# Patient Record
Sex: Female | Born: 1945 | Race: White | Hispanic: No | Marital: Married | Smoking: Never smoker
Health system: Southern US, Community
[De-identification: ages and names within clinical notes are randomized; demographics above are authoritative.]

## PROBLEM LIST (undated history)

## (undated) DIAGNOSIS — G8929 Other chronic pain: Secondary | ICD-10-CM

## (undated) DIAGNOSIS — N200 Calculus of kidney: Secondary | ICD-10-CM

## (undated) DIAGNOSIS — C4491 Basal cell carcinoma of skin, unspecified: Secondary | ICD-10-CM

## (undated) DIAGNOSIS — K219 Gastro-esophageal reflux disease without esophagitis: Secondary | ICD-10-CM

## (undated) DIAGNOSIS — C4490 Unspecified malignant neoplasm of skin, unspecified: Secondary | ICD-10-CM

## (undated) HISTORY — PX: VAGINAL HYSTERECTOMY: SHX2639

## (undated) HISTORY — PX: BACK SURGERY: SHX140

## (undated) HISTORY — DX: Calculus of kidney: N20.0

## (undated) HISTORY — PX: TUBAL LIGATION: SHX77

## (undated) HISTORY — PX: MOHS SURGERY: SUR867

## (undated) HISTORY — PX: TONSILLECTOMY: SUR1361

## (undated) HISTORY — DX: Gastro-esophageal reflux disease without esophagitis: K21.9

## (undated) HISTORY — DX: Other chronic pain: G89.29

## (undated) HISTORY — DX: Unspecified malignant neoplasm of skin, unspecified: C44.90

## (undated) HISTORY — DX: Basal cell carcinoma of skin, unspecified: C44.91

---

## 1998-12-21 ENCOUNTER — Other Ambulatory Visit: Admission: RE | Admit: 1998-12-21 | Discharge: 1998-12-21 | Payer: Self-pay | Admitting: Obstetrics and Gynecology

## 2000-08-01 ENCOUNTER — Encounter: Payer: Self-pay | Admitting: Surgery

## 2000-08-01 ENCOUNTER — Encounter: Admission: RE | Admit: 2000-08-01 | Discharge: 2000-08-01 | Payer: Self-pay | Admitting: Surgery

## 2000-09-25 ENCOUNTER — Other Ambulatory Visit: Admission: RE | Admit: 2000-09-25 | Discharge: 2000-09-25 | Payer: Self-pay | Admitting: Obstetrics and Gynecology

## 2002-10-07 ENCOUNTER — Encounter: Payer: Self-pay | Admitting: Neurosurgery

## 2002-10-07 ENCOUNTER — Ambulatory Visit (HOSPITAL_COMMUNITY): Admission: RE | Admit: 2002-10-07 | Discharge: 2002-10-07 | Payer: Self-pay | Admitting: Neurosurgery

## 2002-10-25 ENCOUNTER — Encounter: Admission: RE | Admit: 2002-10-25 | Discharge: 2002-10-25 | Payer: Self-pay | Admitting: Obstetrics and Gynecology

## 2002-10-25 ENCOUNTER — Encounter: Payer: Self-pay | Admitting: Obstetrics and Gynecology

## 2006-02-06 ENCOUNTER — Encounter: Admission: RE | Admit: 2006-02-06 | Discharge: 2006-02-06 | Payer: Self-pay | Admitting: Plastic Surgery

## 2006-09-16 ENCOUNTER — Other Ambulatory Visit: Admission: RE | Admit: 2006-09-16 | Discharge: 2006-09-16 | Payer: Self-pay | Admitting: *Deleted

## 2007-12-08 ENCOUNTER — Encounter: Admission: RE | Admit: 2007-12-08 | Discharge: 2007-12-08 | Payer: Self-pay | Admitting: *Deleted

## 2008-07-25 ENCOUNTER — Emergency Department (HOSPITAL_COMMUNITY): Admission: EM | Admit: 2008-07-25 | Discharge: 2008-07-25 | Payer: Self-pay | Admitting: Family Medicine

## 2008-07-26 ENCOUNTER — Encounter (INDEPENDENT_AMBULATORY_CARE_PROVIDER_SITE_OTHER): Payer: Self-pay | Admitting: Emergency Medicine

## 2008-07-26 ENCOUNTER — Ambulatory Visit (HOSPITAL_COMMUNITY): Admission: RE | Admit: 2008-07-26 | Discharge: 2008-07-26 | Payer: Self-pay | Admitting: Emergency Medicine

## 2008-07-26 ENCOUNTER — Ambulatory Visit: Payer: Self-pay | Admitting: Vascular Surgery

## 2010-04-17 ENCOUNTER — Encounter: Admission: RE | Admit: 2010-04-17 | Discharge: 2010-04-17 | Payer: Self-pay | Admitting: Obstetrics and Gynecology

## 2011-07-04 ENCOUNTER — Other Ambulatory Visit: Payer: Self-pay | Admitting: Obstetrics and Gynecology

## 2011-07-04 DIAGNOSIS — Z1231 Encounter for screening mammogram for malignant neoplasm of breast: Secondary | ICD-10-CM

## 2011-08-08 ENCOUNTER — Ambulatory Visit: Payer: Self-pay

## 2013-03-30 ENCOUNTER — Ambulatory Visit
Admission: RE | Admit: 2013-03-30 | Discharge: 2013-03-30 | Disposition: A | Discharge status: Home / Self Care | Payer: Self-pay | Source: Ambulatory Visit

## 2013-03-30 ENCOUNTER — Other Ambulatory Visit: Payer: Self-pay

## 2013-03-30 DIAGNOSIS — Z1231 Encounter for screening mammogram for malignant neoplasm of breast: Secondary | ICD-10-CM

## 2014-07-06 ENCOUNTER — Other Ambulatory Visit: Payer: Self-pay

## 2014-07-06 DIAGNOSIS — Z1231 Encounter for screening mammogram for malignant neoplasm of breast: Secondary | ICD-10-CM

## 2014-07-25 ENCOUNTER — Ambulatory Visit
Admission: RE | Admit: 2014-07-25 | Discharge: 2014-07-25 | Disposition: A | Discharge status: Home / Self Care | Payer: Medicare Other | Source: Ambulatory Visit

## 2014-07-25 DIAGNOSIS — Z1231 Encounter for screening mammogram for malignant neoplasm of breast: Secondary | ICD-10-CM

## 2015-10-13 DIAGNOSIS — Z01419 Encounter for gynecological examination (general) (routine) without abnormal findings: Secondary | ICD-10-CM | POA: Diagnosis not present

## 2015-10-13 DIAGNOSIS — Z1272 Encounter for screening for malignant neoplasm of vagina: Secondary | ICD-10-CM | POA: Diagnosis not present

## 2015-10-13 DIAGNOSIS — Z90710 Acquired absence of both cervix and uterus: Secondary | ICD-10-CM | POA: Diagnosis not present

## 2015-10-16 DIAGNOSIS — B029 Zoster without complications: Secondary | ICD-10-CM | POA: Diagnosis not present

## 2015-10-20 DIAGNOSIS — L853 Xerosis cutis: Secondary | ICD-10-CM | POA: Diagnosis not present

## 2015-10-20 DIAGNOSIS — Z85828 Personal history of other malignant neoplasm of skin: Secondary | ICD-10-CM | POA: Diagnosis not present

## 2015-11-03 DIAGNOSIS — Z0100 Encounter for examination of eyes and vision without abnormal findings: Secondary | ICD-10-CM | POA: Diagnosis not present

## 2015-12-13 DIAGNOSIS — R0789 Other chest pain: Secondary | ICD-10-CM | POA: Diagnosis not present

## 2016-05-24 DIAGNOSIS — Z0000 Encounter for general adult medical examination without abnormal findings: Secondary | ICD-10-CM | POA: Diagnosis not present

## 2016-07-04 DIAGNOSIS — E559 Vitamin D deficiency, unspecified: Secondary | ICD-10-CM | POA: Diagnosis not present

## 2016-07-04 DIAGNOSIS — D72819 Decreased white blood cell count, unspecified: Secondary | ICD-10-CM | POA: Diagnosis not present

## 2016-07-09 DIAGNOSIS — N200 Calculus of kidney: Secondary | ICD-10-CM | POA: Diagnosis not present

## 2016-07-09 DIAGNOSIS — G459 Transient cerebral ischemic attack, unspecified: Secondary | ICD-10-CM | POA: Diagnosis not present

## 2016-07-09 DIAGNOSIS — Z85828 Personal history of other malignant neoplasm of skin: Secondary | ICD-10-CM | POA: Diagnosis not present

## 2016-07-09 DIAGNOSIS — Z820 Family history of epilepsy and other diseases of the nervous system: Secondary | ICD-10-CM | POA: Diagnosis not present

## 2016-07-09 DIAGNOSIS — Z0001 Encounter for general adult medical examination with abnormal findings: Secondary | ICD-10-CM | POA: Diagnosis not present

## 2016-07-11 ENCOUNTER — Other Ambulatory Visit: Payer: Self-pay | Admitting: Internal Medicine

## 2016-07-11 DIAGNOSIS — G459 Transient cerebral ischemic attack, unspecified: Secondary | ICD-10-CM

## 2016-07-11 DIAGNOSIS — R202 Paresthesia of skin: Secondary | ICD-10-CM

## 2016-07-22 ENCOUNTER — Ambulatory Visit
Admission: RE | Admit: 2016-07-22 | Discharge: 2016-07-22 | Disposition: A | Discharge status: Home / Self Care | Payer: Medicare Other | Source: Ambulatory Visit | Attending: Internal Medicine | Admitting: Internal Medicine

## 2016-07-22 DIAGNOSIS — R930 Abnormal findings on diagnostic imaging of skull and head, not elsewhere classified: Secondary | ICD-10-CM | POA: Diagnosis not present

## 2016-07-22 DIAGNOSIS — G459 Transient cerebral ischemic attack, unspecified: Secondary | ICD-10-CM | POA: Diagnosis not present

## 2016-07-22 DIAGNOSIS — R202 Paresthesia of skin: Secondary | ICD-10-CM

## 2016-09-12 ENCOUNTER — Encounter: Payer: Self-pay | Admitting: Neurology

## 2016-09-12 ENCOUNTER — Ambulatory Visit (INDEPENDENT_AMBULATORY_CARE_PROVIDER_SITE_OTHER): Payer: Medicare Other | Admitting: Neurology

## 2016-09-12 ENCOUNTER — Telehealth: Payer: Self-pay

## 2016-09-12 VITALS — BP 124/70 | HR 78 | Resp 16 | Ht 65.0 in | Wt 153.0 lb

## 2016-09-12 DIAGNOSIS — I749 Embolism and thrombosis of unspecified artery: Secondary | ICD-10-CM | POA: Diagnosis not present

## 2016-09-12 DIAGNOSIS — G459 Transient cerebral ischemic attack, unspecified: Secondary | ICD-10-CM

## 2016-09-12 MED ORDER — ASPIRIN EC 81 MG PO TBEC: 81.0000 mg | DELAYED_RELEASE_TABLET | Freq: Every day | ORAL | 5 refills | Status: DC

## 2016-09-12 NOTE — Progress Notes (Signed)
Provider:  Larey Seat, M D  Referring Provider: Deland Pretty, MD Primary Care Physician:  Horatio Pel, MD  Chief Complaint  Patient presents with  . New Patient (Initial Visit)    Rm 11. Patient reports that she had a "quick" episode of nausea and R leg heaviness. This occured about 6-8 weeks ago.     HPI:  Sally Richardson is a 71 y.o. female seen here as a referral  from Dr. Shelia Media / Dr. 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 He was concerned that this could be a TIA. Her leg felt heavy what she was talking to her son and granddaughter and she tried lifting and moving it. She was able to do so but the right leg felt very different from her left. At the place was no possibility to sit down, so she drove herself home and took an 81 mg aspirin. 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 She was already referred for Carotid Doppler studies which returned normal and an MRI of the brain which also returned to normal limits.  The only question is if she should continue to take antiplatelet therapy and if there are any other risk factors that need to be evaluated such as possible paroxysmal atrial fibrillation, Echo and bubble study.  EKG was normal.   Past medical history is positive for  Frequent ocular migraines, aura, chronic lower back pain, vitamin D deficiency, paresthesias of the right foot, calcium kidney stones, basal cell carcinoma, possible TIA. Her right patella has not worked well ever since she had lower back surgery, numbness on the lateral right foot, L 5    Mother and brother had massive strokes, brother died at age 24.   Social history :  She drinks one or 2 coffees a day but no other caffeinated sodas or ice tea,  she drinks mostly water. She is not a nicotine user  and socially drinks alcohol seldomly. 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     Review of Systems: Out of a complete 14 system review, the patient complains of only the following symptoms, and all other reviewed systems are negative.   Social History   Social History  . Marital status: Married    Spouse name: N/A  . Number of children: 1  . Years of education: RN   Occupational History  . [employer]    Social History Main Topics  . Smoking status: Never Smoker  . Smokeless tobacco: Never Used  . Alcohol use Yes     Comment: Occ  . Drug use: No  . Sexual activity: Not on file   Other Topics Concern  . Not on file   Social History Narrative   Drinks 1-2 cups of coffee a day     No family history on file.  Past Medical History:  Diagnosis Date  . Skin cancer     Past Surgical History:  Procedure Laterality Date  . [place], 2010  . MOHS SURGERY    . TONSILLECTOMY    . TUBAL LIGATION    . VAGINAL HYSTERECTOMY      Current Outpatient Prescriptions  Medication Sig Dispense Refill  . cholecalciferol (VITAMIN D) 1000 units tablet Take 1,000 Units by mouth daily.    Marland Kitchen estradiol (ESTRACE) 0.5 MG tablet   11  . ibuprofen (ADVIL,MOTRIN) 200 MG tablet Take 200 mg by mouth every 6 (six) hours as needed.    . Multiple Vitamins-Minerals (MULTIVITAMIN ADULT PO) Take by mouth.     No current facility-administered medications for this visit.     Allergies as of 09/12/2016  . (No Known Allergies)    Vitals: BP 124/70   Pulse 78   Resp 16   Ht 5\' 5"  (1.651 m)   Wt 153 lb (69.4 kg)   BMI 25.46 kg/m  Last Weight:  Wt Readings from Last 1 Encounters:  09/12/16 153 lb (69.4 kg)   Last Height:   Ht Readings from Last 1 Encounters:  09/12/16 5\' 5"  (1.651 m)    Physical exam:  General: The patient is awake, alert and appears  not in acute distress. The patient is well groomed. Head: Normocephalic, atraumatic. Neck is supple. Mallampati 2, neck circumference:14.25  Cardiovascular:  Regular rate and rhythm, without  murmurs or carotid bruit, and without distended neck veins. Respiratory: Lungs are clear to auscultation. Skin:  Without evidence of edema, or rash Trunk: BMI is elevated and patient  has normal posture.  Neurologic exam :The patient is awake and alert, oriented to place and time.  Memory subjective described as intact. There is a normal attention span & concentration ability. Speech is fluent without  dysarthria, dysphonia or aphasia. Mood and affect are appropriate. Cranial nerves:Pupils are equal and briskly reactive to light. Funduscopic exam without evidence of pallor or edema. Extraocular movements  in vertical and horizontal planes intact and without nystagmus.  Visual fields by finger perimetry are intact. Hearing to finger rub intact.  Facial sensation intact to fine touch. Facial motor strength is symmetric and tongue and uvula move midline. Tongue protrusion into either cheek is normal. Shoulder shrug is normal.  Motor exam:   Normal tone ,muscle bulk and symmetric  strength in all extremities. Sensory:  Numbness in L5 dermatome, right leg only.  Fine touch, pinprick and vibration were tested in all extremities. Proprioception was normal. Coordination: Rapid alternating movements in the fingers/hands were normal. Finger-to-nose maneuver  normal without evidence of ataxia, dysmetria or tremor. Gait and station: Patient walks without assistive device and is able unassisted to climb up to the exam table. Strength within normal limits. Stance is stable and normal. Tandem gait is unfragmented. Romberg testing is negative Deep tendon reflexes: in the  upper extremities are symmetric and intact. Patella attenuated on the right.  Babinski maneuver response is  downgoing.   Assessment:  After physical and  neurologic examination, review of laboratory studies, imaging, neurophysiology testing and pre-existing records, assessment is that of :   [name] described her right lower extremity heaviness spell as being distinctly different from any dysesthesia she may have encountered before related to radiculopathy and lower back degeneration.  I do share the feel that this could have been a TIA, and we would expect the MRI to be normal unless a stroke would have happened. To complete her workup I will order an echocardiogram, a bubble study, and a portable cardiac monitor. None of this will keep her out of the OR.  She needs to start taking a baby ASA 81 mg, if not tolerated daily, take every other day.   Plan:  Treatment plan and additional workup : see  above, RV in 4 weeks.       Asencion Partridge Krista Godsil MD 09/12/2016

## 2016-09-12 NOTE — Telephone Encounter (Signed)
Dr. Brett Fairy would like patient get Bubble study, echocardiogram, and cardiac event monitor completed at [facility]. Patient is in surgery 3 days a week, and Cardiology may be able to work with her schedule.

## 2016-09-16 NOTE — Telephone Encounter (Signed)
Patient is scheduled for her Echocardiogram Bubble and cardiac monitor 10/01/2016 arrive at 7:15 am and after echo she will pick up monitor. I have spoke to patient and she is aware of her appointment and apt scheduled for her day off.  Thanks Hinton Dyer

## 2016-09-30 ENCOUNTER — Other Ambulatory Visit: Payer: Self-pay | Admitting: Neurology

## 2016-09-30 DIAGNOSIS — I4891 Unspecified atrial fibrillation: Secondary | ICD-10-CM

## 2016-09-30 DIAGNOSIS — I749 Embolism and thrombosis of unspecified artery: Principal | ICD-10-CM

## 2016-09-30 DIAGNOSIS — G459 Transient cerebral ischemic attack, unspecified: Secondary | ICD-10-CM

## 2016-10-01 ENCOUNTER — Ambulatory Visit (INDEPENDENT_AMBULATORY_CARE_PROVIDER_SITE_OTHER): Payer: Medicare Other

## 2016-10-01 ENCOUNTER — Ambulatory Visit (HOSPITAL_COMMUNITY): Payer: Medicare Other | Attending: Cardiovascular Disease

## 2016-10-01 ENCOUNTER — Other Ambulatory Visit: Payer: Self-pay

## 2016-10-01 DIAGNOSIS — I4891 Unspecified atrial fibrillation: Secondary | ICD-10-CM

## 2016-10-01 DIAGNOSIS — I340 Nonrheumatic mitral (valve) insufficiency: Secondary | ICD-10-CM | POA: Insufficient documentation

## 2016-10-01 DIAGNOSIS — I749 Embolism and thrombosis of unspecified artery: Secondary | ICD-10-CM | POA: Diagnosis not present

## 2016-10-01 DIAGNOSIS — G459 Transient cerebral ischemic attack, unspecified: Secondary | ICD-10-CM

## 2016-10-02 ENCOUNTER — Telehealth: Payer: Self-pay

## 2016-10-02 NOTE — Telephone Encounter (Signed)
I spoke to patient and she is aware of results.  Patient states that no one has ever mentioned mitral valve prolapse, and asks if you can expand on this?  Also she has the heart monitor on now, asks how long you would like for her to wear it (hoping for as little time as possible)?   If you call her and she does not answer, ok to leave message and a time that she can call you back at.

## 2016-10-02 NOTE — Telephone Encounter (Signed)
-----   Message from Larey Seat, MD sent at 10/01/2016 10:09 AM EST ----- Echocardiogram in normal limits - possible mitral valve prolaps? No thrombos found. Good result CD

## 2016-10-02 NOTE — Telephone Encounter (Signed)
I explained to Mrs. caregiver that I suspect there may be a mitral valve prolapse but this is not a pathological finding, but rather a variation of the norm. She also received a cardiac monitor yesterday and is already tired of wearing it. I explained to her that it is meant to be worn for 30 days, but if at any time  atrial fibrillation is revealed, we can discontinue there. CD

## 2016-11-21 ENCOUNTER — Other Ambulatory Visit: Payer: Self-pay | Admitting: Obstetrics and Gynecology

## 2016-11-21 DIAGNOSIS — Z1231 Encounter for screening mammogram for malignant neoplasm of breast: Secondary | ICD-10-CM

## 2016-12-02 ENCOUNTER — Telehealth: Payer: Self-pay

## 2016-12-02 NOTE — Telephone Encounter (Signed)
-----   Message from Larey Seat, MD sent at 12/02/2016 10:04 AM EDT ----- All normal cardiac monitor results. CD

## 2016-12-02 NOTE — Telephone Encounter (Signed)
I called pt, left a detailed message on pt's home phone per DPR, advising pt that her cardiac monitor results were normal, and if she had any questions to call the office back.

## 2016-12-03 ENCOUNTER — Telehealth: Payer: Self-pay | Admitting: Neurology

## 2016-12-03 NOTE — Telephone Encounter (Signed)
Patient requests call back from nurse regarding if followup apt is necessary since she got the results of the tests. She insists her apt was on 5/3 not 5/7 and cannot come on 5/7. Best call back is [phone]

## 2016-12-04 NOTE — Telephone Encounter (Signed)
I spoke to Dr. Brett Fairy. Dr. Brett Fairy said that since the pt's testing was all normal, she does not need a follow up appt. If her symptoms worsen or fail to improve, pt should call our office.  I called pt to discuss. No answer, left a message asking her to call me back.

## 2016-12-04 NOTE — Telephone Encounter (Signed)
Patient called office and notified of Dr. Edwena Felty message patient voiced understanding.

## 2016-12-09 ENCOUNTER — Ambulatory Visit: Payer: Self-pay | Admitting: Neurology

## 2016-12-11 ENCOUNTER — Other Ambulatory Visit: Payer: Self-pay | Admitting: Obstetrics and Gynecology

## 2016-12-11 DIAGNOSIS — R5381 Other malaise: Secondary | ICD-10-CM

## 2016-12-13 ENCOUNTER — Other Ambulatory Visit: Payer: Self-pay | Admitting: Obstetrics and Gynecology

## 2016-12-13 ENCOUNTER — Other Ambulatory Visit: Payer: Self-pay | Admitting: Internal Medicine

## 2016-12-13 DIAGNOSIS — R5381 Other malaise: Secondary | ICD-10-CM

## 2016-12-13 DIAGNOSIS — E2839 Other primary ovarian failure: Secondary | ICD-10-CM

## 2017-01-02 ENCOUNTER — Ambulatory Visit
Admission: RE | Admit: 2017-01-02 | Discharge: 2017-01-02 | Disposition: A | Discharge status: Home / Self Care | Payer: Medicare Other | Source: Ambulatory Visit | Attending: Obstetrics and Gynecology | Admitting: Obstetrics and Gynecology

## 2017-01-02 DIAGNOSIS — Z1231 Encounter for screening mammogram for malignant neoplasm of breast: Secondary | ICD-10-CM

## 2017-01-02 DIAGNOSIS — Z13820 Encounter for screening for osteoporosis: Secondary | ICD-10-CM | POA: Diagnosis not present

## 2017-01-02 DIAGNOSIS — E2839 Other primary ovarian failure: Secondary | ICD-10-CM

## 2017-08-29 DIAGNOSIS — D72819 Decreased white blood cell count, unspecified: Secondary | ICD-10-CM | POA: Diagnosis not present

## 2017-09-10 DIAGNOSIS — Z0000 Encounter for general adult medical examination without abnormal findings: Secondary | ICD-10-CM | POA: Diagnosis not present

## 2018-05-28 IMAGING — US US CAROTID DUPLEX BILAT
1 series · 13 of 24 positions shown · non-contrast
Comparison: None.

CLINICAL DATA: TIA.  Right leg heaviness transiently 2 months ago.

EXAM:
BILATERAL CAROTID DUPLEX ULTRASOUND
TECHNIQUE: Gray scale imaging, color Doppler and duplex ultrasound was
performed of bilateral carotid and vertebral arteries in the neck.
TECHNIQUE: Quantification of carotid stenosis is based on velocity parameters
that correlate the residual internal carotid diameter with
NASCET-based stenosis levels, using the diameter of the distal
internal carotid lumen as the denominator for stenosis measurement.

[Series 1: us carotid duplex bilat · 0.06mm/px · 13 of 53 slices shown]
[im 1/53]
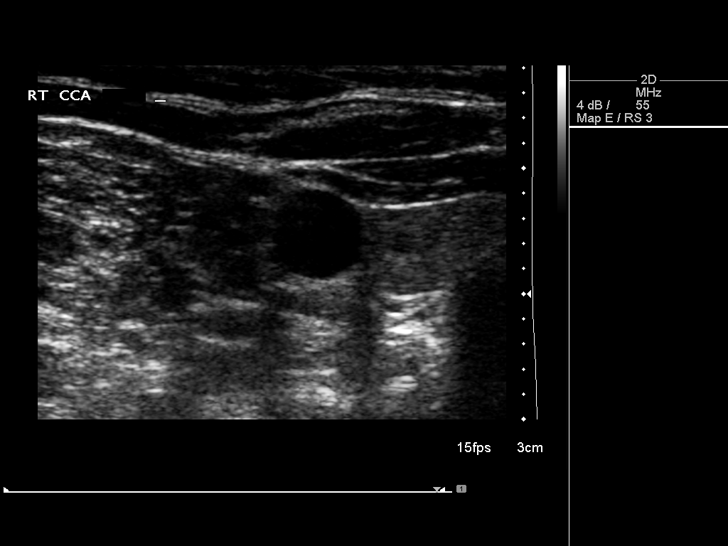
[im 5/53]
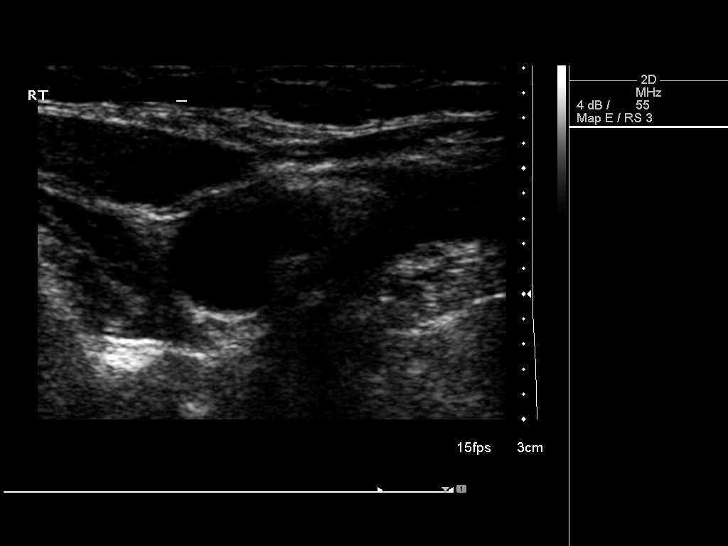
[im 10/53]
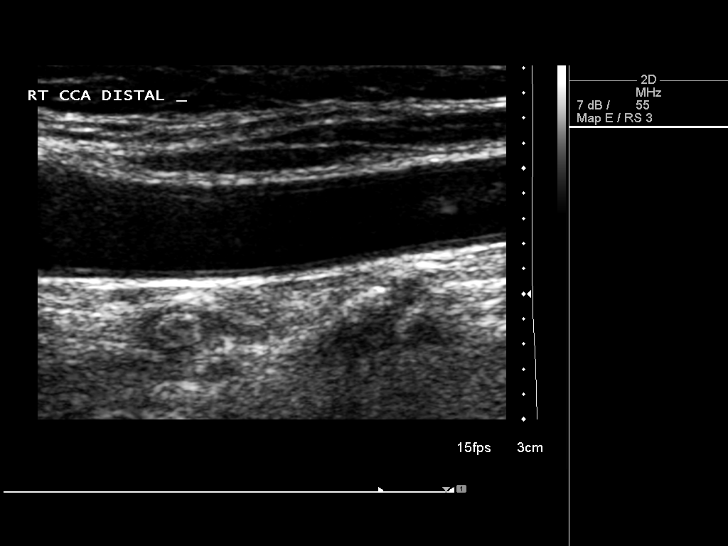
[im 14/53]
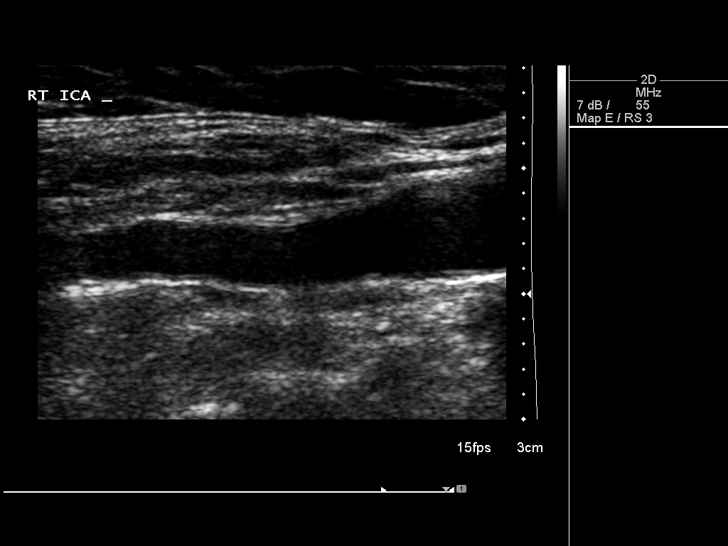
[im 19/53]
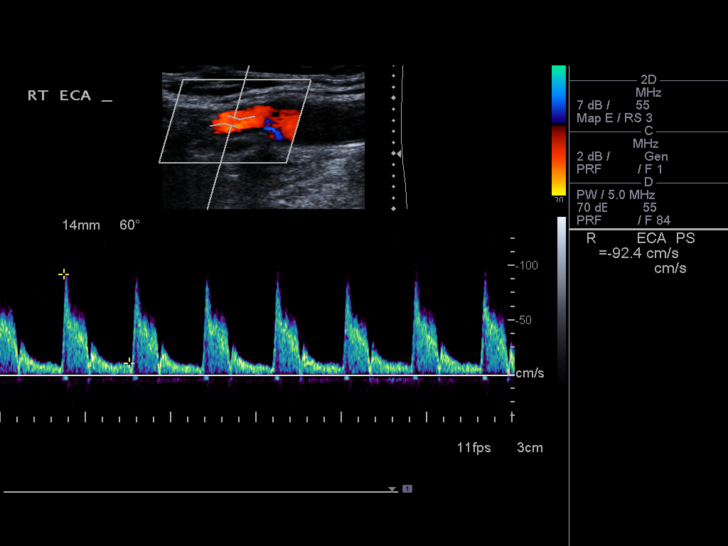
[im 23/53]
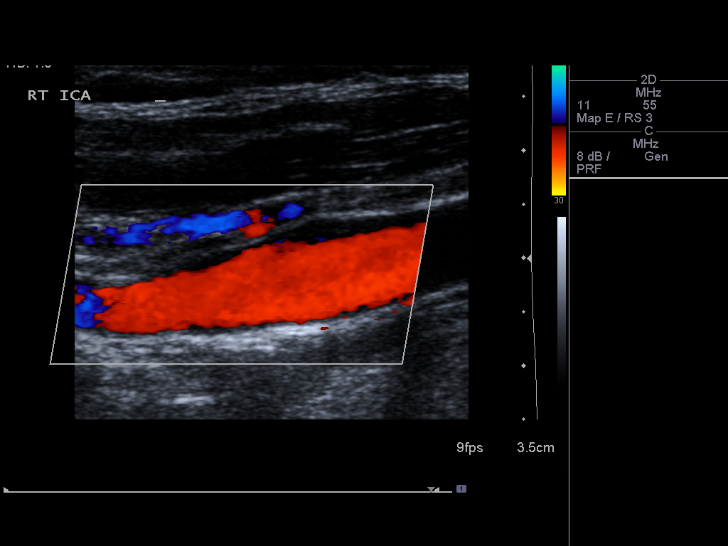
[im 28/53]
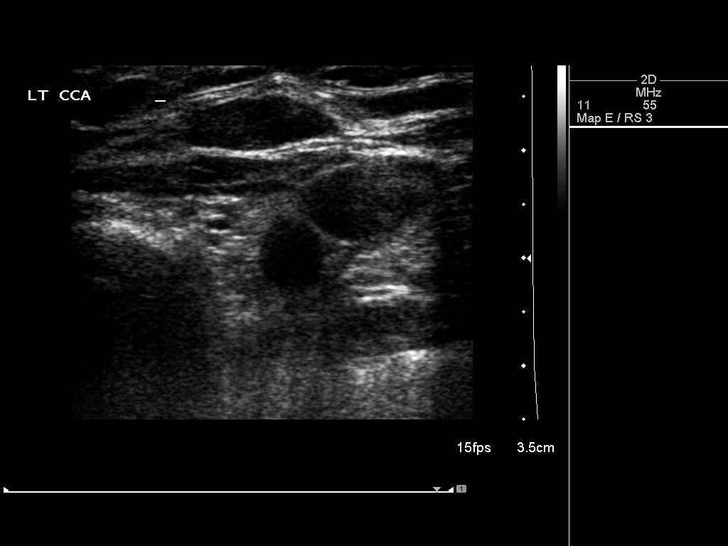
[im 30/53]
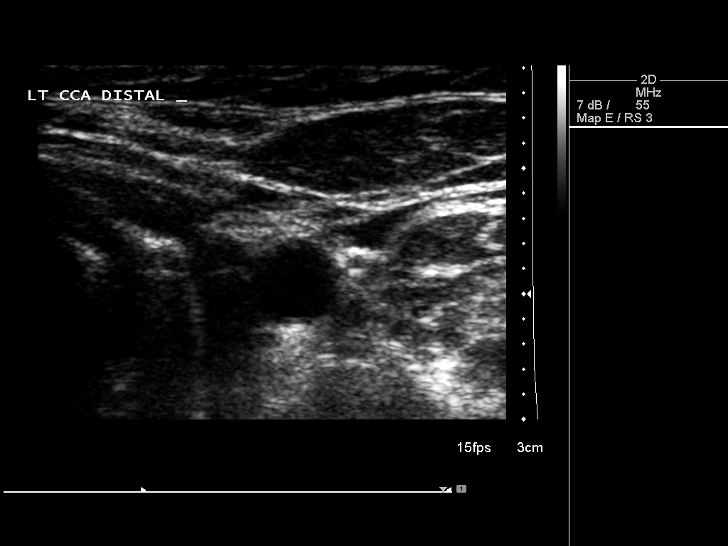
[im 34/53]
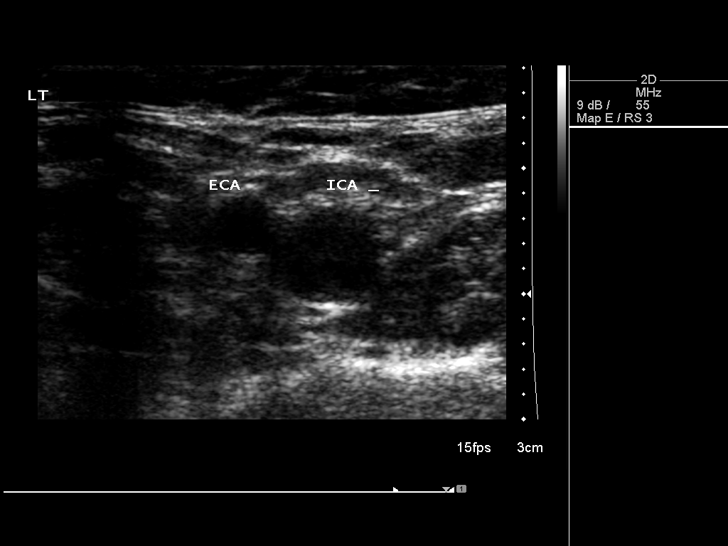
[im 39/53]
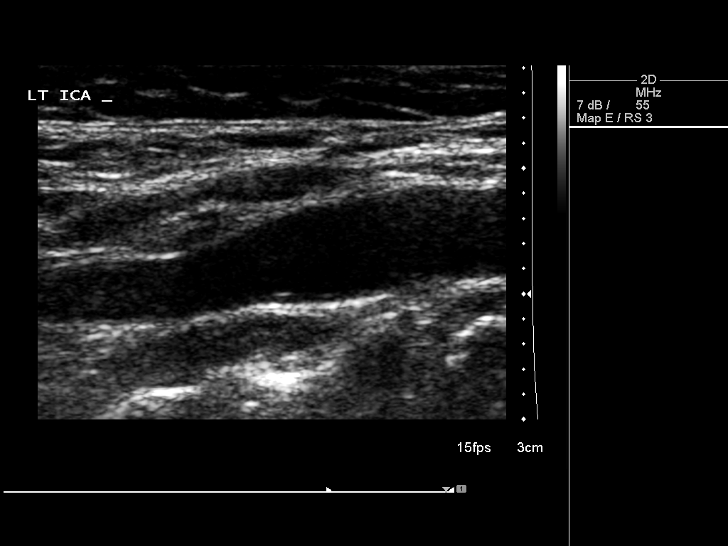
[im 43/53]
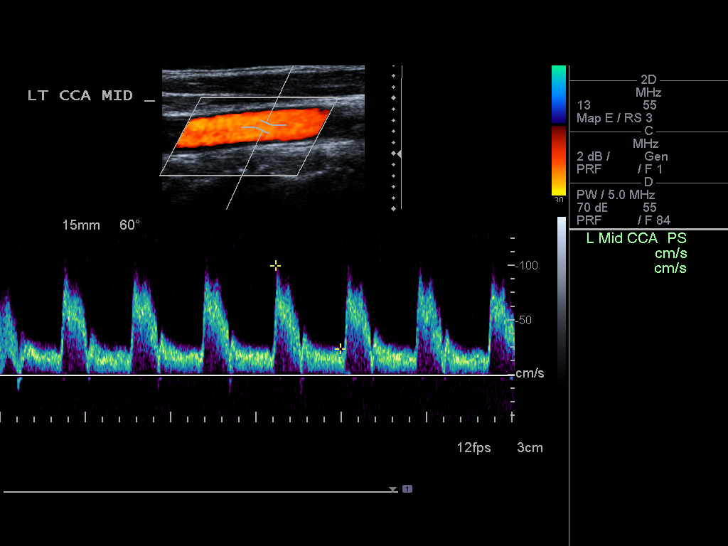
[im 48/53]
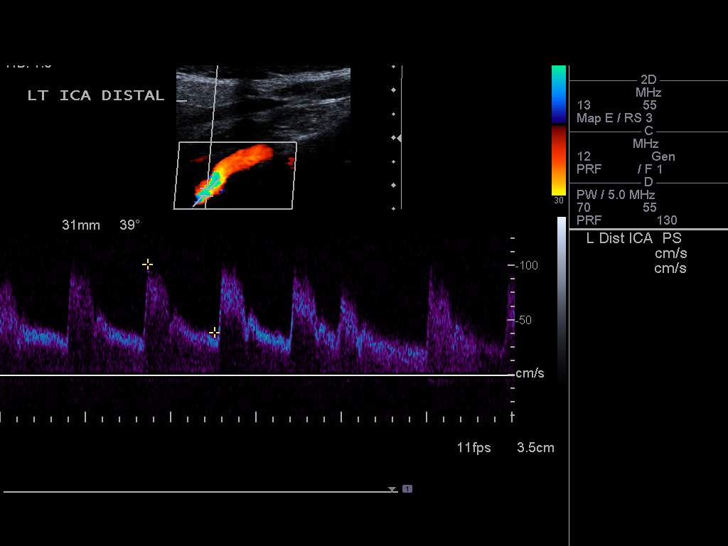
[im 53/53]
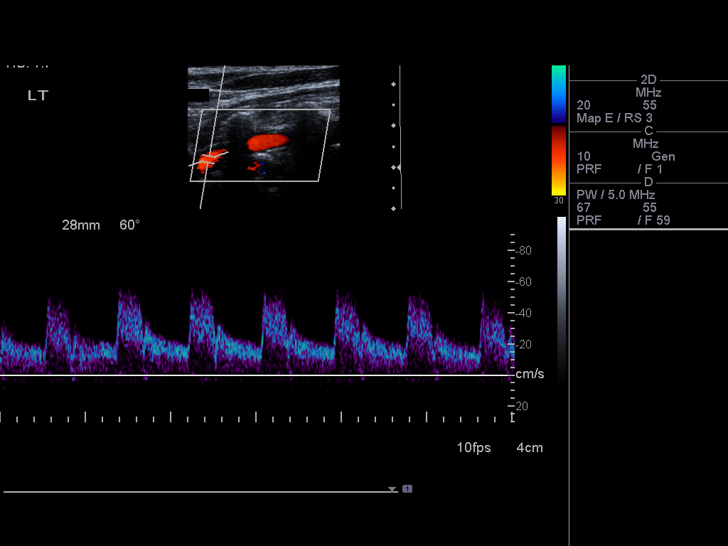

[13 of 24 positions shown; findings below may reference images not displayed]

The following velocity measurements were obtained:

PEAK SYSTOLIC/END DIASTOLIC

RIGHT

ICA:                     87/35cm/sec

CCA:                     99/19cm/sec

SYSTOLIC ICA/CCA RATIO:

DIASTOLIC ICA/CCA RATIO:

ECA:                     92cm/sec

LEFT

ICA:                     102/39cm/sec

CCA:                     107/23cm/sec

SYSTOLIC ICA/CCA RATIO:

DIASTOLIC ICA/CCA RATIO:

ECA:                     94cm/sec
FINDINGS: RIGHT CAROTID ARTERY: No significant plaque or stenosis. Normal
waveforms and color Doppler signal.

RIGHT VERTEBRAL ARTERY:  Normal flow direction and waveform.

LEFT CAROTID ARTERY: No plaque or stenosis. Normal waveforms and
color Doppler signal.

LEFT VERTEBRAL ARTERY: Normal flow direction and waveform.
IMPRESSION: Negative

## 2018-09-04 DIAGNOSIS — Z1272 Encounter for screening for malignant neoplasm of vagina: Secondary | ICD-10-CM | POA: Diagnosis not present

## 2018-09-04 DIAGNOSIS — Z6826 Body mass index (BMI) 26.0-26.9, adult: Secondary | ICD-10-CM | POA: Diagnosis not present

## 2018-09-04 DIAGNOSIS — Z01419 Encounter for gynecological examination (general) (routine) without abnormal findings: Secondary | ICD-10-CM | POA: Diagnosis not present

## 2018-09-10 DIAGNOSIS — Z7289 Other problems related to lifestyle: Secondary | ICD-10-CM | POA: Diagnosis not present

## 2018-09-10 DIAGNOSIS — Z1159 Encounter for screening for other viral diseases: Secondary | ICD-10-CM | POA: Diagnosis not present

## 2018-09-10 DIAGNOSIS — Z0000 Encounter for general adult medical examination without abnormal findings: Secondary | ICD-10-CM | POA: Diagnosis not present

## 2018-09-10 DIAGNOSIS — D72819 Decreased white blood cell count, unspecified: Secondary | ICD-10-CM | POA: Diagnosis not present

## 2018-09-15 DIAGNOSIS — M545 Low back pain: Secondary | ICD-10-CM | POA: Diagnosis not present

## 2018-09-15 DIAGNOSIS — Z820 Family history of epilepsy and other diseases of the nervous system: Secondary | ICD-10-CM | POA: Diagnosis not present

## 2018-09-15 DIAGNOSIS — Z85828 Personal history of other malignant neoplasm of skin: Secondary | ICD-10-CM | POA: Diagnosis not present

## 2018-09-15 DIAGNOSIS — Z0000 Encounter for general adult medical examination without abnormal findings: Secondary | ICD-10-CM | POA: Diagnosis not present

## 2018-10-02 ENCOUNTER — Other Ambulatory Visit: Payer: Self-pay | Admitting: Obstetrics and Gynecology

## 2018-10-02 DIAGNOSIS — Z1231 Encounter for screening mammogram for malignant neoplasm of breast: Secondary | ICD-10-CM

## 2018-10-16 ENCOUNTER — Other Ambulatory Visit: Payer: Self-pay

## 2018-10-16 ENCOUNTER — Ambulatory Visit
Admission: RE | Admit: 2018-10-16 | Discharge: 2018-10-16 | Disposition: A | Discharge status: Home / Self Care | Payer: Medicare Other | Source: Ambulatory Visit | Attending: Obstetrics and Gynecology | Admitting: Obstetrics and Gynecology

## 2018-10-16 DIAGNOSIS — Z1231 Encounter for screening mammogram for malignant neoplasm of breast: Secondary | ICD-10-CM

## 2019-07-23 DIAGNOSIS — D2272 Melanocytic nevi of left lower limb, including hip: Secondary | ICD-10-CM | POA: Diagnosis not present

## 2019-07-23 DIAGNOSIS — C44719 Basal cell carcinoma of skin of left lower limb, including hip: Secondary | ICD-10-CM | POA: Diagnosis not present

## 2019-07-23 DIAGNOSIS — C44712 Basal cell carcinoma of skin of right lower limb, including hip: Secondary | ICD-10-CM | POA: Diagnosis not present

## 2019-07-23 DIAGNOSIS — Q828 Other specified congenital malformations of skin: Secondary | ICD-10-CM | POA: Diagnosis not present

## 2019-07-23 DIAGNOSIS — D485 Neoplasm of uncertain behavior of skin: Secondary | ICD-10-CM | POA: Diagnosis not present

## 2019-07-23 DIAGNOSIS — L905 Scar conditions and fibrosis of skin: Secondary | ICD-10-CM | POA: Diagnosis not present

## 2019-09-16 ENCOUNTER — Telehealth: Payer: Self-pay | Admitting: Hematology and Oncology

## 2019-09-16 DIAGNOSIS — Z0000 Encounter for general adult medical examination without abnormal findings: Secondary | ICD-10-CM | POA: Diagnosis not present

## 2019-09-16 NOTE — Telephone Encounter (Signed)
Received an urgent call from [name] at Sally Richardson office for dx of leukemia. Sally Richardson has been scheduled to see Sally Richardson on 2/12 at [place] aware that the pt needs to arrive 20 minutes early. I asked that she fax the information to our office.

## 2019-09-17 ENCOUNTER — Inpatient Hospital Stay: Payer: Medicare Other | Attending: Hematology and Oncology

## 2019-09-17 ENCOUNTER — Inpatient Hospital Stay: Payer: Medicare Other | Admitting: Hematology and Oncology

## 2019-09-17 ENCOUNTER — Encounter: Payer: Self-pay | Admitting: Hematology and Oncology

## 2019-09-17 ENCOUNTER — Other Ambulatory Visit: Payer: Self-pay

## 2019-09-17 VITALS — BP 105/72 | HR 97 | Temp 97.8°F | Resp 18 | Ht 65.5 in | Wt 164.5 lb

## 2019-09-17 DIAGNOSIS — D61818 Other pancytopenia: Secondary | ICD-10-CM

## 2019-09-17 DIAGNOSIS — Z791 Long term (current) use of non-steroidal anti-inflammatories (NSAID): Secondary | ICD-10-CM | POA: Diagnosis not present

## 2019-09-17 DIAGNOSIS — G8929 Other chronic pain: Secondary | ICD-10-CM | POA: Diagnosis not present

## 2019-09-17 DIAGNOSIS — D698 Other specified hemorrhagic conditions: Secondary | ICD-10-CM | POA: Diagnosis not present

## 2019-09-17 DIAGNOSIS — Z85828 Personal history of other malignant neoplasm of skin: Secondary | ICD-10-CM | POA: Diagnosis not present

## 2019-09-17 DIAGNOSIS — K219 Gastro-esophageal reflux disease without esophagitis: Secondary | ICD-10-CM | POA: Diagnosis not present

## 2019-09-17 DIAGNOSIS — M549 Dorsalgia, unspecified: Secondary | ICD-10-CM | POA: Insufficient documentation

## 2019-09-17 LAB — SURGICAL PATHOLOGY

## 2019-09-17 LAB — PHOSPHORUS: Phosphorus: 2.7 mg/dL (ref 2.5–4.6)

## 2019-09-17 LAB — RETIC PANEL
Immature Retic Fract: 27.7 % — ABNORMAL HIGH (ref 2.3–15.9)
RBC.: 2.93 MIL/uL — ABNORMAL LOW (ref 3.87–5.11)
Retic Count, Absolute: 59.8 10*3/uL (ref 19.0–186.0)
Retic Ct Pct: 2 % (ref 0.4–3.1)
Reticulocyte Hemoglobin: 42.4 pg (ref >27.9)

## 2019-09-17 LAB — DIC (DISSEMINATED INTRAVASCULAR COAGULATION)PANEL
D-Dimer, Quant: 1.04 ug/mL-FEU — ABNORMAL HIGH (ref 0.00–0.50)
Fibrinogen: 263 mg/dL (ref 210–475)
INR: 1 (ref 0.8–1.2)
Platelets: 79 10*3/uL — ABNORMAL LOW (ref 150–400)
Prothrombin Time: 12.9 seconds (ref 11.4–15.2)
Smear Review: NONE SEEN
aPTT: 31 seconds (ref 24–36)

## 2019-09-17 LAB — CMP (CANCER CENTER ONLY)
ALT: 18 U/L (ref 0–44)
AST: 19 U/L (ref 15–41)
Albumin: 4.5 g/dL (ref 3.5–5.0)
Alkaline Phosphatase: 81 U/L (ref 38–126)
Anion gap: 8 (ref 5–15)
BUN: 14 mg/dL (ref 8–23)
CO2: 25 mmol/L (ref 22–32)
Calcium: 8.8 mg/dL — ABNORMAL LOW (ref 8.9–10.3)
Chloride: 106 mmol/L (ref 98–111)
Creatinine: 0.76 mg/dL (ref 0.44–1.00)
GFR, Est AFR Am: >60 mL/min (ref >60)
GFR, Estimated: >60 mL/min (ref >60)
Glucose, Bld: 97 mg/dL (ref 70–99)
Potassium: 4 mmol/L (ref 3.5–5.1)
Sodium: 139 mmol/L (ref 135–145)
Total Bilirubin: 0.7 mg/dL (ref 0.3–1.2)
Total Protein: 7.7 g/dL (ref 6.5–8.1)

## 2019-09-17 LAB — CBC WITH DIFFERENTIAL (CANCER CENTER ONLY)
Abs Immature Granulocytes: 0 10*3/uL (ref 0.00–0.07)
Basophils Absolute: 0 10*3/uL (ref 0.0–0.1)
Basophils Relative: 0 %
Eosinophils Absolute: 0 10*3/uL (ref 0.0–0.5)
Eosinophils Relative: 0 %
HCT: 30.9 % — ABNORMAL LOW (ref 36.0–46.0)
Hemoglobin: 10.5 g/dL — ABNORMAL LOW (ref 12.0–15.0)
Immature Granulocytes: 0 %
Lymphocytes Relative: 57 %
Lymphs Abs: 0.7 10*3/uL (ref 0.7–4.0)
MCH: 35.8 pg — ABNORMAL HIGH (ref 26.0–34.0)
MCHC: 34 g/dL (ref 30.0–36.0)
MCV: 105.5 fL — ABNORMAL HIGH (ref 80.0–100.0)
Monocytes Absolute: 0.1 10*3/uL (ref 0.1–1.0)
Monocytes Relative: 5 %
Neutro Abs: 0.5 10*3/uL — ABNORMAL LOW (ref 1.7–7.7)
Neutrophils Relative %: 38 %
Platelet Count: 73 10*3/uL — ABNORMAL LOW (ref 150–400)
RBC Morphology: 1
RBC: 2.93 MIL/uL — ABNORMAL LOW (ref 3.87–5.11)
RDW: 15.3 % (ref 11.5–15.5)
WBC Count: 1.2 10*3/uL — ABNORMAL LOW (ref 4.0–10.5)
WBC Morphology: 1
nRBC: 0 % (ref 0.0–0.2)

## 2019-09-17 LAB — FOLATE: Folate: 38.7 ng/mL (ref >5.9)

## 2019-09-17 LAB — PATHOLOGIST SMEAR REVIEW

## 2019-09-17 LAB — VITAMIN B12: Vitamin B-12: 654 pg/mL (ref 180–914)

## 2019-09-17 LAB — URIC ACID: Uric Acid, Serum: 4.2 mg/dL (ref 2.5–7.1)

## 2019-09-17 LAB — SAVE SMEAR(SSMR), FOR PROVIDER SLIDE REVIEW

## 2019-09-17 LAB — LACTATE DEHYDROGENASE: LDH: 174 U/L (ref 98–192)

## 2019-09-17 NOTE — Progress Notes (Signed)
[place] [phone]   Fax:([phone] [place] NOTE  Patient Care Team: Deland Pretty, MD as PCP - General (Internal Medicine)  Hematological/Oncological History # Pancytopenia  1) 09/10/2018: routine blood work during PCP visit shows WBC 4.5, Hgb 14.2, MCV 95, Plt 360 2) 09/16/2019: routine blood work shows WBC 1.5, Hgb 10.5, Plt 70, MCV 106.1. Circulating blasts noted on report 3) 09/17/2019: establish care with Dr. Lorenso Courier  CHIEF COMPLAINTS/PURPOSE OF CONSULTATION:  "Pancytopenia with Circulating Blasts "  HISTORY OF PRESENTING ILLNESS:  Sally Richardson 74 y.o. female with medical history significant for basal cell carcinoma, GERD, and chronic back pain presents for evaluation of pancytopenia.   On review of the previous records Mrs. Gladhill was seen by her PCP on 09/15/2018 and had routine blood work performed at that time.  The blood work showed a white blood cell count of 1.5 hemoglobin 10.5 platelets of 70 with an MCV of 106.1.  Circulating blast were noted on that report.  The patient had last been seen by her PCP on 09/10/2018 at which time routine blood work showed white blood cell count of 4.5 hemoglobin 14.2 MCV of 95 and platelets of 360.  Due to concern for this rapid change in her blood count she was referred to hematology for further evaluation management.  On exam today Ms. give it notes that she is entirely asymptomatic.  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  She notes that she has not had any shortness of breath, chest pain or any other symptoms in the last months.  She reports no increase in bleeding and actually endorses weight gain during the pandemic.  She notes that she eats an unrestricted diet and consumes red meat, pork, and vegetables.  She notes no recent medication changes, though does note that she use a supplement called Immunity which contains elderberry, vitamin C, etc. she notes no  other changes in her health over the last several months.  A full 10 point ROS is listed below.  Of note the patient does have a history of basal cell carcinoma and just in the last few weeks had several of these removed from her left leg and right arm.  She notes that she does not have any family history of hematological disorders or malignancies.  She lives in [place], [place] with her husband.  She has good functional status and is actually still working 2 days a week in a surgical center.  MEDICAL HISTORY:  Past Medical History:  Diagnosis Date  . Basal cell adenocarcinoma   . Chronic back pain   . GERD (gastroesophageal reflux disease)   . Nephrolithiasis   . Skin cancer     SURGICAL HISTORY: Past Surgical History:  Procedure Laterality Date  . [place], 2010  . MOHS SURGERY    . TONSILLECTOMY    . TUBAL LIGATION    . VAGINAL HYSTERECTOMY      SOCIAL HISTORY: Social History   Socioeconomic History  . Marital status: Married    Spouse name: Not on file  . Number of children: 1  . Years of education: Therapist, sports  . Highest education level: Not on file  Occupational History  . Occupation: [employer]  Tobacco Use  . Smoking status: Never Smoker  . Smokeless tobacco: Never Used  Substance and Sexual Activity  . Alcohol use: Yes    Comment: Occ  . Drug  use: No  . Sexual activity: Not on file  Other Topics Concern  . Not on file  Social History Narrative   Drinks 1-2 cups of coffee a day    Social Determinants of Health   Financial Resource Strain:   . Difficulty of Paying Living Expenses: Not on file  Food Insecurity:   . Worried About Charity fundraiser in the Last Year: Not on file  . Ran Out of Food in the Last Year: Not on file  Transportation Needs:   . Lack of Transportation (Medical): Not on file  . Lack of Transportation (Non-Medical): Not on file  Physical Activity:   . Days of Exercise per Week: Not on file  . Minutes of  Exercise per Session: Not on file  Stress:   . Feeling of Stress : Not on file  Social Connections:   . Frequency of Communication with Friends and Family: Not on file  . Frequency of Social Gatherings with Friends and Family: Not on file  . Attends Religious Services: Not on file  . Active Member of Clubs or Organizations: Not on file  . Attends Archivist Meetings: Not on file  . Marital Status: Not on file  Intimate Partner Violence:   . Fear of Current or Ex-Partner: Not on file  . Emotionally Abused: Not on file  . Physically Abused: Not on file  . Sexually Abused: Not on file    FAMILY HISTORY: Family History  Problem Relation Age of Onset  . Stroke Mother   . Heart attack Father   . Cerebral aneurysm Brother   . Breast cancer Neg Hx     ALLERGIES:  has No Known Allergies.  MEDICATIONS:  Current Outpatient Medications  Medication Sig Dispense Refill  . cholecalciferol (VITAMIN D) 1000 units tablet Take 1,000 Units by mouth daily.    Marland Kitchen estradiol (ESTRACE) 0.5 MG tablet   11  . ibuprofen (ADVIL,MOTRIN) 200 MG tablet Take 200 mg by mouth every 6 (six) hours as needed.    . Multiple Vitamins-Minerals (MULTIVITAMIN ADULT PO) Take by mouth.     No current facility-administered medications for this visit.    REVIEW OF SYSTEMS:   Constitutional: ( - ) fevers, ( - )  chills , ( - ) night sweats Eyes: ( - ) blurriness of vision, ( - ) double vision, ( - ) watery eyes Ears, nose, mouth, throat, and face: ( - ) mucositis, ( - ) sore throat Respiratory: ( - ) cough, ( - ) dyspnea, ( - ) wheezes Cardiovascular: ( - ) palpitation, ( - ) chest discomfort, ( - ) lower extremity swelling Gastrointestinal:  ( - ) nausea, ( - ) heartburn, ( - ) change in bowel habits Skin: ( - ) abnormal skin rashes Lymphatics: ( - ) new lymphadenopathy, ( - ) easy bruising Neurological: ( - ) numbness, ( - ) tingling, ( - ) new weaknesses Behavioral/Psych: ( - ) mood change, ( - ) new  changes  All other systems were reviewed with the patient and are negative.  PHYSICAL EXAMINATION: ECOG PERFORMANCE STATUS: 0 - Asymptomatic  Vitals:   09/17/19 0810  BP: 105/72  Pulse: 97  Resp: 18  Temp: 97.8 F (36.6 C)  SpO2: 100%   Filed Weights   09/17/19 0810  Weight: 164 lb 8 oz (74.6 kg)    GENERAL: well appearing elderly Caucasian in NAD, appears younger than stated age.   SKIN: skin color, texture, turgor are  normal, no rashes or significant lesions EYES: conjunctiva are pink and non-injected, sclera clear OROPHARYNX: no exudate, no erythema; lips, buccal mucosa, and tongue normal  NECK: supple, non-tender LYMPH:  no palpable lymphadenopathy in the cervical, axillary or supraclavicular lymph nodes LUNGS: clear to auscultation and percussion with normal breathing effort HEART: regular rate & rhythm and no murmurs and no lower extremity edema ABDOMEN: soft, non-tender, non-distended, normal bowel sounds Musculoskeletal: no cyanosis of digits and no clubbing  PSYCH: alert & oriented x 3, fluent speech NEURO: no focal motor/sensory deficits  LABORATORY DATA:  I have reviewed the data as listed CBC Latest Ref Rng & Units 09/17/2019 09/17/2019  WBC 4.0 - 10.5 K/uL 1.2(L) -  Hemoglobin 12.0 - 15.0 g/dL 10.5(L) -  Hematocrit 36.0 - 46.0 % 30.9(L) -  Platelets 150 - 400 K/uL PENDING 73(L)    CMP Latest Ref Rng & Units 09/17/2019  Glucose 70 - 99 mg/dL 97  BUN 8 - 23 mg/dL 14  Creatinine 0.44 - 1.00 mg/dL 0.76  Sodium 135 - 145 mmol/L 139  Potassium 3.5 - 5.1 mmol/L 4.0  Chloride 98 - 111 mmol/L 106  CO2 22 - 32 mmol/L 25  Calcium 8.9 - 10.3 mg/dL 8.8(L)  Total Protein 6.5 - 8.1 g/dL 7.7  Total Bilirubin 0.3 - 1.2 mg/dL 0.7  Alkaline Phos 38 - 126 U/L 81  AST 15 - 41 U/L 19  ALT 0 - 44 U/L 18    PATHOLOGY: None relevant to review.   BLOOD FILM: Review of the peripheral blood smear showed markedly reduced number of white cells with lymphocytic predominance.  Rare segmented neutrophil with 3 blasts noted. Lymphocytes remain normal in size without any predominance of large granular lymphocytes. Red cells show no anisopoikilocytosis, macrocytes , microcytes or polychromasia. There were no schistocytes, target cells, echinocytes, acanthocytes, dacrocytes, or stomatocytes.There was no rouleaux formation, nucleated red cells, or intra-cellular inclusions noted. The platelets are normal in size, shape, and color without any clumping evident, however they are markedly reduced in number.   RADIOGRAPHIC STUDIES: None relevant to review.  No results found.  ASSESSMENT & PLAN VERNETTA DIZDAREVIC 74 y.o. female with medical history significant for basal cell carcinoma, GERD, and chronic back pain presents for evaluation of pancytopenia.  The patient had routine blood work performed yesterday which showed pancytopenia and circulating blasts.  At this time our strongest concern is for acute myeloid leukemia given how rapidly this pancytopenia and developed.  The patient was found to have a CBC perfectly within normal limits exactly 1 year ago.  Today's visit will focus on attempting to rule out AML and considering other possible causes of the patient's pancytopenia.  If the patient is found to have circulating blasts we will discuss the case with [facility] to determine if they would be wanting to take the patient into their inpatient service.  Unfortunately today as of Friday and no bone marrow biopsy or additional work-up would be able to be performed over the weekend.  We will try to coordinate the logistics with [facility] in the event that it appears she has an acute leukemia.  In preparation for this today we will order a DIC panel, LDH, and uric acid.  Additionally we will do nutritional work-up including vitamin B12, folate, MMA and homocystine given the macrocytosis, though it is highly unlikely this is secondary to a nutritional deficiency in such a  short span of time.  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 Will send blood film for pathology review --flow cytometry ordered as well, though may not have enough WBC to process --will collect a DIC panel, LDH, uric acid for baseline.  --additionally will collect nutritional studies with Vitamin b12, folate, MMA, and homocysteine --if blasts are noted patient will need to be evaluated at [facility] as the treatment of acute leukemia is not offered at [facility]. Will discuss with [facility] Leukemia team today  --RTC pending the above workup. Will likely require establishing care with [facility].   *Update: Blasts seen on peripheral blood film. discussed with [facility] Dr. Lynn Ito. He requested the patient should present today for admission to Leukemia Service. Unfortunately patient declined and wanted to spend some more time at home prior to admission. The alternative suggestion was to have Dr. Lynn Ito see Mrs. Mccarter in clinic on Tuesday with bone marrow biopsy and likely admission. She was agreeable to this plan.   Orders Placed This Encounter  Procedures  . CBC with Differential (Cancer Center Only)    Standing Status:   Future    Number of Occurrences:   1    Standing Expiration Date:   09/16/2020  . Save Smear (SSMR)    Standing Status:   Future    Number of Occurrences:   1    Standing Expiration Date:   09/16/2020  . Retic Panel    Standing Status:   Future    Number of Occurrences:   1    Standing Expiration Date:   09/16/2020  . CMP ([facility] only)    Standing Status:   Future    Number of Occurrences:   1    Standing Expiration Date:   09/16/2020  . Lactate dehydrogenase (LDH)    Standing Status:   Future    Number of Occurrences:   1    Standing Expiration Date:   09/16/2020  . Flow Cytometry    Standing Status:   Future    Number of Occurrences:   1     Standing Expiration Date:   09/16/2020  . Pathologist smear review    Standing Status:   Future    Number of Occurrences:   1    Standing Expiration Date:   09/16/2020  . Vitamin B12    Standing Status:   Future    Number of Occurrences:   1    Standing Expiration Date:   09/16/2020  . Folate, Serum    Standing Status:   Future    Number of Occurrences:   1    Standing Expiration Date:   09/16/2020  . Methylmalonic acid, serum    Standing Status:   Future    Number of Occurrences:   1    Standing Expiration Date:   09/16/2020  . Homocysteine, serum    Standing Status:   Future    Number of Occurrences:   1    Standing Expiration Date:   09/16/2020  . DIC (disseminated intravasc coag) panel    Standing Status:   Future    Number of Occurrences:   1    Standing Expiration Date:   09/16/2020  . Uric acid    Standing Status:   Future    Number of Occurrences:   1    Standing Expiration Date:   09/16/2020  . Phosphorus    Standing Status:   Future  Number of Occurrences:   1    Standing Expiration Date:   09/16/2020    All questions were answered. The patient knows to call the clinic with any problems, questions or concerns.  A total of more than 60 minutes were spent on this encounter and over half of that time was spent on counseling and coordination of care as outlined above.   Ledell Peoples, MD Department of Hematology/Oncology [place] at [facility] Phone: [phone] Pager: [phone] Email: [email or web address]  09/17/2019 10:42 AM

## 2019-09-18 LAB — HOMOCYSTEINE: Homocysteine: 5.9 umol/L (ref 0.0–19.2)

## 2019-09-20 ENCOUNTER — Telehealth: Payer: Self-pay | Admitting: Hematology and Oncology

## 2019-09-20 LAB — FLOW CYTOMETRY

## 2019-09-20 NOTE — Telephone Encounter (Signed)
No los per 2/12

## 2019-09-21 DIAGNOSIS — D61818 Other pancytopenia: Secondary | ICD-10-CM | POA: Diagnosis not present

## 2019-09-21 DIAGNOSIS — C9200 Acute myeloblastic leukemia, not having achieved remission: Secondary | ICD-10-CM | POA: Diagnosis not present

## 2019-09-21 DIAGNOSIS — C9500 Acute leukemia of unspecified cell type not having achieved remission: Secondary | ICD-10-CM | POA: Diagnosis not present

## 2019-09-21 DIAGNOSIS — Z79899 Other long term (current) drug therapy: Secondary | ICD-10-CM | POA: Diagnosis not present

## 2019-09-21 DIAGNOSIS — Z9889 Other specified postprocedural states: Secondary | ICD-10-CM | POA: Diagnosis not present

## 2019-09-22 DIAGNOSIS — D61818 Other pancytopenia: Secondary | ICD-10-CM | POA: Diagnosis not present

## 2019-09-24 LAB — METHYLMALONIC ACID, SERUM: Methylmalonic Acid, Quantitative: 103 nmol/L (ref 0–378)

## 2019-09-28 DIAGNOSIS — G4700 Insomnia, unspecified: Secondary | ICD-10-CM | POA: Diagnosis not present

## 2019-09-28 DIAGNOSIS — F418 Other specified anxiety disorders: Secondary | ICD-10-CM | POA: Diagnosis not present

## 2019-09-28 DIAGNOSIS — I509 Heart failure, unspecified: Secondary | ICD-10-CM | POA: Diagnosis not present

## 2019-09-28 DIAGNOSIS — Z01810 Encounter for preprocedural cardiovascular examination: Secondary | ICD-10-CM | POA: Diagnosis not present

## 2019-09-28 DIAGNOSIS — I491 Atrial premature depolarization: Secondary | ICD-10-CM | POA: Diagnosis not present

## 2019-09-28 DIAGNOSIS — D61818 Other pancytopenia: Secondary | ICD-10-CM | POA: Diagnosis not present

## 2019-09-28 DIAGNOSIS — C9200 Acute myeloblastic leukemia, not having achieved remission: Secondary | ICD-10-CM | POA: Diagnosis not present

## 2019-09-29 DIAGNOSIS — C9200 Acute myeloblastic leukemia, not having achieved remission: Secondary | ICD-10-CM | POA: Diagnosis not present

## 2019-09-29 DIAGNOSIS — D61818 Other pancytopenia: Secondary | ICD-10-CM | POA: Diagnosis not present

## 2019-09-29 DIAGNOSIS — G4700 Insomnia, unspecified: Secondary | ICD-10-CM | POA: Diagnosis not present

## 2019-09-29 DIAGNOSIS — F418 Other specified anxiety disorders: Secondary | ICD-10-CM | POA: Diagnosis not present

## 2019-09-30 DIAGNOSIS — C9200 Acute myeloblastic leukemia, not having achieved remission: Secondary | ICD-10-CM | POA: Diagnosis not present

## 2019-09-30 DIAGNOSIS — D61818 Other pancytopenia: Secondary | ICD-10-CM | POA: Diagnosis not present

## 2019-09-30 DIAGNOSIS — G4700 Insomnia, unspecified: Secondary | ICD-10-CM | POA: Diagnosis not present

## 2019-09-30 DIAGNOSIS — F418 Other specified anxiety disorders: Secondary | ICD-10-CM | POA: Diagnosis not present

## 2019-10-01 DIAGNOSIS — D61818 Other pancytopenia: Secondary | ICD-10-CM | POA: Diagnosis not present

## 2019-10-01 DIAGNOSIS — C9200 Acute myeloblastic leukemia, not having achieved remission: Secondary | ICD-10-CM | POA: Diagnosis not present

## 2019-10-01 DIAGNOSIS — F418 Other specified anxiety disorders: Secondary | ICD-10-CM | POA: Diagnosis not present

## 2019-10-01 DIAGNOSIS — G4700 Insomnia, unspecified: Secondary | ICD-10-CM | POA: Diagnosis not present

## 2019-10-02 DIAGNOSIS — C9200 Acute myeloblastic leukemia, not having achieved remission: Secondary | ICD-10-CM | POA: Diagnosis not present

## 2019-10-02 DIAGNOSIS — F418 Other specified anxiety disorders: Secondary | ICD-10-CM | POA: Diagnosis not present

## 2019-10-02 DIAGNOSIS — D61818 Other pancytopenia: Secondary | ICD-10-CM | POA: Diagnosis not present

## 2019-10-02 DIAGNOSIS — G4700 Insomnia, unspecified: Secondary | ICD-10-CM | POA: Diagnosis not present

## 2019-10-03 DIAGNOSIS — C9200 Acute myeloblastic leukemia, not having achieved remission: Secondary | ICD-10-CM | POA: Diagnosis not present

## 2019-10-03 DIAGNOSIS — D61818 Other pancytopenia: Secondary | ICD-10-CM | POA: Diagnosis not present

## 2019-10-03 DIAGNOSIS — F418 Other specified anxiety disorders: Secondary | ICD-10-CM | POA: Diagnosis not present

## 2019-10-03 DIAGNOSIS — G4700 Insomnia, unspecified: Secondary | ICD-10-CM | POA: Diagnosis not present

## 2019-10-04 DIAGNOSIS — D61818 Other pancytopenia: Secondary | ICD-10-CM | POA: Diagnosis not present

## 2019-10-04 DIAGNOSIS — C9200 Acute myeloblastic leukemia, not having achieved remission: Secondary | ICD-10-CM | POA: Diagnosis not present

## 2019-10-04 DIAGNOSIS — F418 Other specified anxiety disorders: Secondary | ICD-10-CM | POA: Diagnosis not present

## 2019-10-04 DIAGNOSIS — G4700 Insomnia, unspecified: Secondary | ICD-10-CM | POA: Diagnosis not present

## 2019-10-05 ENCOUNTER — Telehealth: Payer: Self-pay

## 2019-10-05 DIAGNOSIS — D61818 Other pancytopenia: Secondary | ICD-10-CM | POA: Diagnosis not present

## 2019-10-05 DIAGNOSIS — G4700 Insomnia, unspecified: Secondary | ICD-10-CM | POA: Diagnosis not present

## 2019-10-05 DIAGNOSIS — C9200 Acute myeloblastic leukemia, not having achieved remission: Secondary | ICD-10-CM | POA: Diagnosis not present

## 2019-10-05 DIAGNOSIS — F418 Other specified anxiety disorders: Secondary | ICD-10-CM | POA: Diagnosis not present

## 2019-10-05 NOTE — Telephone Encounter (Signed)
VM message from [facility] from [facility] stating Pt. needed appointments for lab and possible transfusion. Return call to [employer] RN spoke with Olegario Messier she stated Pt decided to have labs and transfusion at [facility].

## 2019-10-08 DIAGNOSIS — C9200 Acute myeloblastic leukemia, not having achieved remission: Secondary | ICD-10-CM | POA: Diagnosis not present

## 2019-10-08 DIAGNOSIS — D61818 Other pancytopenia: Secondary | ICD-10-CM | POA: Diagnosis not present

## 2019-10-12 DIAGNOSIS — D61818 Other pancytopenia: Secondary | ICD-10-CM | POA: Diagnosis not present

## 2019-10-12 DIAGNOSIS — C9200 Acute myeloblastic leukemia, not having achieved remission: Secondary | ICD-10-CM | POA: Diagnosis not present

## 2019-10-15 DIAGNOSIS — D61818 Other pancytopenia: Secondary | ICD-10-CM | POA: Diagnosis not present

## 2019-10-15 DIAGNOSIS — C9200 Acute myeloblastic leukemia, not having achieved remission: Secondary | ICD-10-CM | POA: Diagnosis not present

## 2019-10-19 DIAGNOSIS — C9200 Acute myeloblastic leukemia, not having achieved remission: Secondary | ICD-10-CM | POA: Diagnosis not present

## 2019-10-22 DIAGNOSIS — Z95828 Presence of other vascular implants and grafts: Secondary | ICD-10-CM | POA: Diagnosis not present

## 2019-10-22 DIAGNOSIS — K5900 Constipation, unspecified: Secondary | ICD-10-CM | POA: Diagnosis not present

## 2019-10-22 DIAGNOSIS — R110 Nausea: Secondary | ICD-10-CM | POA: Diagnosis not present

## 2019-10-22 DIAGNOSIS — C9200 Acute myeloblastic leukemia, not having achieved remission: Secondary | ICD-10-CM | POA: Diagnosis not present

## 2019-10-22 DIAGNOSIS — I313 Pericardial effusion (noninflammatory): Secondary | ICD-10-CM | POA: Diagnosis not present

## 2019-10-22 DIAGNOSIS — G4700 Insomnia, unspecified: Secondary | ICD-10-CM | POA: Diagnosis not present

## 2019-10-26 DIAGNOSIS — Z95828 Presence of other vascular implants and grafts: Secondary | ICD-10-CM | POA: Diagnosis not present

## 2019-10-26 DIAGNOSIS — C9200 Acute myeloblastic leukemia, not having achieved remission: Secondary | ICD-10-CM | POA: Diagnosis not present

## 2019-10-26 DIAGNOSIS — I313 Pericardial effusion (noninflammatory): Secondary | ICD-10-CM | POA: Diagnosis not present

## 2019-10-26 DIAGNOSIS — K5900 Constipation, unspecified: Secondary | ICD-10-CM | POA: Diagnosis not present

## 2019-10-26 DIAGNOSIS — R140 Abdominal distension (gaseous): Secondary | ICD-10-CM | POA: Diagnosis not present

## 2019-10-26 DIAGNOSIS — F329 Major depressive disorder, single episode, unspecified: Secondary | ICD-10-CM | POA: Diagnosis not present

## 2019-10-26 DIAGNOSIS — D709 Neutropenia, unspecified: Secondary | ICD-10-CM | POA: Diagnosis not present

## 2019-10-26 DIAGNOSIS — D61818 Other pancytopenia: Secondary | ICD-10-CM | POA: Diagnosis not present

## 2019-10-26 DIAGNOSIS — G4700 Insomnia, unspecified: Secondary | ICD-10-CM | POA: Diagnosis not present

## 2019-10-26 DIAGNOSIS — R110 Nausea: Secondary | ICD-10-CM | POA: Diagnosis not present

## 2019-11-02 DIAGNOSIS — C9201 Acute myeloblastic leukemia, in remission: Secondary | ICD-10-CM | POA: Diagnosis not present

## 2019-11-02 DIAGNOSIS — C9200 Acute myeloblastic leukemia, not having achieved remission: Secondary | ICD-10-CM | POA: Diagnosis not present

## 2019-11-03 DIAGNOSIS — Z7682 Awaiting organ transplant status: Secondary | ICD-10-CM | POA: Diagnosis not present

## 2019-11-03 DIAGNOSIS — C9200 Acute myeloblastic leukemia, not having achieved remission: Secondary | ICD-10-CM | POA: Diagnosis not present

## 2019-11-09 DIAGNOSIS — G4700 Insomnia, unspecified: Secondary | ICD-10-CM | POA: Diagnosis not present

## 2019-11-09 DIAGNOSIS — Z79899 Other long term (current) drug therapy: Secondary | ICD-10-CM | POA: Diagnosis not present

## 2019-11-09 DIAGNOSIS — Z87898 Personal history of other specified conditions: Secondary | ICD-10-CM | POA: Diagnosis not present

## 2019-11-09 DIAGNOSIS — K5900 Constipation, unspecified: Secondary | ICD-10-CM | POA: Diagnosis not present

## 2019-11-09 DIAGNOSIS — Z95828 Presence of other vascular implants and grafts: Secondary | ICD-10-CM | POA: Diagnosis not present

## 2019-11-09 DIAGNOSIS — Z792 Long term (current) use of antibiotics: Secondary | ICD-10-CM | POA: Diagnosis not present

## 2019-11-09 DIAGNOSIS — R142 Eructation: Secondary | ICD-10-CM | POA: Diagnosis not present

## 2019-11-09 DIAGNOSIS — R110 Nausea: Secondary | ICD-10-CM | POA: Diagnosis not present

## 2019-11-09 DIAGNOSIS — C9200 Acute myeloblastic leukemia, not having achieved remission: Secondary | ICD-10-CM | POA: Diagnosis not present

## 2019-11-09 DIAGNOSIS — F329 Major depressive disorder, single episode, unspecified: Secondary | ICD-10-CM | POA: Diagnosis not present

## 2019-11-09 DIAGNOSIS — Z5111 Encounter for antineoplastic chemotherapy: Secondary | ICD-10-CM | POA: Diagnosis not present

## 2019-11-09 DIAGNOSIS — R143 Flatulence: Secondary | ICD-10-CM | POA: Diagnosis not present

## 2019-11-09 DIAGNOSIS — D709 Neutropenia, unspecified: Secondary | ICD-10-CM | POA: Diagnosis not present

## 2019-11-10 DIAGNOSIS — C9200 Acute myeloblastic leukemia, not having achieved remission: Secondary | ICD-10-CM | POA: Diagnosis not present

## 2019-11-10 DIAGNOSIS — Z5111 Encounter for antineoplastic chemotherapy: Secondary | ICD-10-CM | POA: Diagnosis not present

## 2019-11-11 DIAGNOSIS — Z5111 Encounter for antineoplastic chemotherapy: Secondary | ICD-10-CM | POA: Diagnosis not present

## 2019-11-11 DIAGNOSIS — C9200 Acute myeloblastic leukemia, not having achieved remission: Secondary | ICD-10-CM | POA: Diagnosis not present

## 2019-11-12 DIAGNOSIS — Z5111 Encounter for antineoplastic chemotherapy: Secondary | ICD-10-CM | POA: Diagnosis not present

## 2019-11-12 DIAGNOSIS — C9200 Acute myeloblastic leukemia, not having achieved remission: Secondary | ICD-10-CM | POA: Diagnosis not present

## 2019-11-13 DIAGNOSIS — Z5111 Encounter for antineoplastic chemotherapy: Secondary | ICD-10-CM | POA: Diagnosis not present

## 2019-11-13 DIAGNOSIS — C9200 Acute myeloblastic leukemia, not having achieved remission: Secondary | ICD-10-CM | POA: Diagnosis not present

## 2019-11-15 DIAGNOSIS — D61818 Other pancytopenia: Secondary | ICD-10-CM | POA: Diagnosis not present

## 2019-11-15 DIAGNOSIS — C9200 Acute myeloblastic leukemia, not having achieved remission: Secondary | ICD-10-CM | POA: Diagnosis not present

## 2019-11-15 DIAGNOSIS — Z5111 Encounter for antineoplastic chemotherapy: Secondary | ICD-10-CM | POA: Diagnosis not present

## 2019-11-16 DIAGNOSIS — Z5111 Encounter for antineoplastic chemotherapy: Secondary | ICD-10-CM | POA: Diagnosis not present

## 2019-11-16 DIAGNOSIS — C9200 Acute myeloblastic leukemia, not having achieved remission: Secondary | ICD-10-CM | POA: Diagnosis not present

## 2019-11-23 DIAGNOSIS — C9200 Acute myeloblastic leukemia, not having achieved remission: Secondary | ICD-10-CM | POA: Diagnosis not present

## 2019-11-30 DIAGNOSIS — F329 Major depressive disorder, single episode, unspecified: Secondary | ICD-10-CM | POA: Diagnosis not present

## 2019-11-30 DIAGNOSIS — C9201 Acute myeloblastic leukemia, in remission: Secondary | ICD-10-CM | POA: Diagnosis not present

## 2019-11-30 DIAGNOSIS — Z95828 Presence of other vascular implants and grafts: Secondary | ICD-10-CM | POA: Diagnosis not present

## 2019-11-30 DIAGNOSIS — C9200 Acute myeloblastic leukemia, not having achieved remission: Secondary | ICD-10-CM | POA: Diagnosis not present

## 2019-11-30 DIAGNOSIS — R110 Nausea: Secondary | ICD-10-CM | POA: Diagnosis not present

## 2019-11-30 DIAGNOSIS — K5900 Constipation, unspecified: Secondary | ICD-10-CM | POA: Diagnosis not present

## 2019-11-30 DIAGNOSIS — G4700 Insomnia, unspecified: Secondary | ICD-10-CM | POA: Diagnosis not present

## 2019-11-30 DIAGNOSIS — D709 Neutropenia, unspecified: Secondary | ICD-10-CM | POA: Diagnosis not present

## 2019-12-07 DIAGNOSIS — C9201 Acute myeloblastic leukemia, in remission: Secondary | ICD-10-CM | POA: Diagnosis not present

## 2019-12-14 DIAGNOSIS — C9200 Acute myeloblastic leukemia, not having achieved remission: Secondary | ICD-10-CM | POA: Diagnosis not present

## 2019-12-21 DIAGNOSIS — Z79899 Other long term (current) drug therapy: Secondary | ICD-10-CM | POA: Diagnosis not present

## 2019-12-21 DIAGNOSIS — G4700 Insomnia, unspecified: Secondary | ICD-10-CM | POA: Diagnosis not present

## 2019-12-21 DIAGNOSIS — R110 Nausea: Secondary | ICD-10-CM | POA: Diagnosis not present

## 2019-12-21 DIAGNOSIS — F329 Major depressive disorder, single episode, unspecified: Secondary | ICD-10-CM | POA: Diagnosis not present

## 2019-12-21 DIAGNOSIS — C9201 Acute myeloblastic leukemia, in remission: Secondary | ICD-10-CM | POA: Diagnosis not present

## 2019-12-21 DIAGNOSIS — Z5111 Encounter for antineoplastic chemotherapy: Secondary | ICD-10-CM | POA: Diagnosis not present

## 2019-12-21 DIAGNOSIS — Z95828 Presence of other vascular implants and grafts: Secondary | ICD-10-CM | POA: Diagnosis not present

## 2019-12-21 DIAGNOSIS — K5900 Constipation, unspecified: Secondary | ICD-10-CM | POA: Diagnosis not present

## 2019-12-21 DIAGNOSIS — Z87898 Personal history of other specified conditions: Secondary | ICD-10-CM | POA: Diagnosis not present

## 2019-12-22 DIAGNOSIS — C9200 Acute myeloblastic leukemia, not having achieved remission: Secondary | ICD-10-CM | POA: Diagnosis not present

## 2019-12-22 DIAGNOSIS — Z5111 Encounter for antineoplastic chemotherapy: Secondary | ICD-10-CM | POA: Diagnosis not present

## 2019-12-23 DIAGNOSIS — Z5111 Encounter for antineoplastic chemotherapy: Secondary | ICD-10-CM | POA: Diagnosis not present

## 2019-12-23 DIAGNOSIS — C9200 Acute myeloblastic leukemia, not having achieved remission: Secondary | ICD-10-CM | POA: Diagnosis not present

## 2019-12-24 DIAGNOSIS — C9200 Acute myeloblastic leukemia, not having achieved remission: Secondary | ICD-10-CM | POA: Diagnosis not present

## 2019-12-24 DIAGNOSIS — Z5111 Encounter for antineoplastic chemotherapy: Secondary | ICD-10-CM | POA: Diagnosis not present

## 2019-12-25 DIAGNOSIS — C9200 Acute myeloblastic leukemia, not having achieved remission: Secondary | ICD-10-CM | POA: Diagnosis not present

## 2019-12-25 DIAGNOSIS — Z5111 Encounter for antineoplastic chemotherapy: Secondary | ICD-10-CM | POA: Diagnosis not present

## 2019-12-27 DIAGNOSIS — C9201 Acute myeloblastic leukemia, in remission: Secondary | ICD-10-CM | POA: Diagnosis not present

## 2019-12-27 DIAGNOSIS — Z5111 Encounter for antineoplastic chemotherapy: Secondary | ICD-10-CM | POA: Diagnosis not present

## 2019-12-27 DIAGNOSIS — Z452 Encounter for adjustment and management of vascular access device: Secondary | ICD-10-CM | POA: Diagnosis not present

## 2019-12-28 DIAGNOSIS — C9200 Acute myeloblastic leukemia, not having achieved remission: Secondary | ICD-10-CM | POA: Diagnosis not present

## 2019-12-28 DIAGNOSIS — Z5111 Encounter for antineoplastic chemotherapy: Secondary | ICD-10-CM | POA: Diagnosis not present

## 2020-01-05 DIAGNOSIS — C9200 Acute myeloblastic leukemia, not having achieved remission: Secondary | ICD-10-CM | POA: Diagnosis not present

## 2020-01-11 DIAGNOSIS — C9200 Acute myeloblastic leukemia, not having achieved remission: Secondary | ICD-10-CM | POA: Diagnosis not present

## 2020-01-18 DIAGNOSIS — L659 Nonscarring hair loss, unspecified: Secondary | ICD-10-CM | POA: Diagnosis not present

## 2020-01-18 DIAGNOSIS — G4700 Insomnia, unspecified: Secondary | ICD-10-CM | POA: Diagnosis not present

## 2020-01-18 DIAGNOSIS — K5900 Constipation, unspecified: Secondary | ICD-10-CM | POA: Diagnosis not present

## 2020-01-18 DIAGNOSIS — Z79899 Other long term (current) drug therapy: Secondary | ICD-10-CM | POA: Diagnosis not present

## 2020-01-18 DIAGNOSIS — D709 Neutropenia, unspecified: Secondary | ICD-10-CM | POA: Diagnosis not present

## 2020-01-18 DIAGNOSIS — C9200 Acute myeloblastic leukemia, not having achieved remission: Secondary | ICD-10-CM | POA: Diagnosis not present

## 2020-01-18 DIAGNOSIS — Z85828 Personal history of other malignant neoplasm of skin: Secondary | ICD-10-CM | POA: Diagnosis not present

## 2020-01-18 DIAGNOSIS — F329 Major depressive disorder, single episode, unspecified: Secondary | ICD-10-CM | POA: Diagnosis not present

## 2020-01-25 DIAGNOSIS — C9200 Acute myeloblastic leukemia, not having achieved remission: Secondary | ICD-10-CM | POA: Diagnosis not present

## 2020-02-01 DIAGNOSIS — C9200 Acute myeloblastic leukemia, not having achieved remission: Secondary | ICD-10-CM | POA: Diagnosis not present

## 2020-02-01 DIAGNOSIS — Z5111 Encounter for antineoplastic chemotherapy: Secondary | ICD-10-CM | POA: Diagnosis not present

## 2020-02-02 DIAGNOSIS — C9200 Acute myeloblastic leukemia, not having achieved remission: Secondary | ICD-10-CM | POA: Diagnosis not present

## 2020-02-02 DIAGNOSIS — Z5111 Encounter for antineoplastic chemotherapy: Secondary | ICD-10-CM | POA: Diagnosis not present

## 2020-02-03 DIAGNOSIS — Z5111 Encounter for antineoplastic chemotherapy: Secondary | ICD-10-CM | POA: Diagnosis not present

## 2020-02-03 DIAGNOSIS — C9200 Acute myeloblastic leukemia, not having achieved remission: Secondary | ICD-10-CM | POA: Diagnosis not present

## 2020-02-04 DIAGNOSIS — C9200 Acute myeloblastic leukemia, not having achieved remission: Secondary | ICD-10-CM | POA: Diagnosis not present

## 2020-02-04 DIAGNOSIS — Z5111 Encounter for antineoplastic chemotherapy: Secondary | ICD-10-CM | POA: Diagnosis not present

## 2020-02-05 DIAGNOSIS — C9200 Acute myeloblastic leukemia, not having achieved remission: Secondary | ICD-10-CM | POA: Diagnosis not present

## 2020-02-05 DIAGNOSIS — Z5111 Encounter for antineoplastic chemotherapy: Secondary | ICD-10-CM | POA: Diagnosis not present

## 2020-02-08 DIAGNOSIS — Z5111 Encounter for antineoplastic chemotherapy: Secondary | ICD-10-CM | POA: Diagnosis not present

## 2020-02-08 DIAGNOSIS — C9200 Acute myeloblastic leukemia, not having achieved remission: Secondary | ICD-10-CM | POA: Diagnosis not present

## 2020-02-09 DIAGNOSIS — C9200 Acute myeloblastic leukemia, not having achieved remission: Secondary | ICD-10-CM | POA: Diagnosis not present

## 2020-02-09 DIAGNOSIS — Z5111 Encounter for antineoplastic chemotherapy: Secondary | ICD-10-CM | POA: Diagnosis not present

## 2020-02-15 DIAGNOSIS — C9200 Acute myeloblastic leukemia, not having achieved remission: Secondary | ICD-10-CM | POA: Diagnosis not present

## 2020-02-22 DIAGNOSIS — C9200 Acute myeloblastic leukemia, not having achieved remission: Secondary | ICD-10-CM | POA: Diagnosis not present

## 2020-02-29 DIAGNOSIS — C9201 Acute myeloblastic leukemia, in remission: Secondary | ICD-10-CM | POA: Diagnosis not present

## 2020-03-14 DIAGNOSIS — R0982 Postnasal drip: Secondary | ICD-10-CM | POA: Diagnosis not present

## 2020-03-14 DIAGNOSIS — R110 Nausea: Secondary | ICD-10-CM | POA: Diagnosis not present

## 2020-03-14 DIAGNOSIS — C9201 Acute myeloblastic leukemia, in remission: Secondary | ICD-10-CM | POA: Diagnosis not present

## 2020-03-14 DIAGNOSIS — R05 Cough: Secondary | ICD-10-CM | POA: Diagnosis not present

## 2020-03-14 DIAGNOSIS — Z79899 Other long term (current) drug therapy: Secondary | ICD-10-CM | POA: Diagnosis not present

## 2020-03-14 DIAGNOSIS — J069 Acute upper respiratory infection, unspecified: Secondary | ICD-10-CM | POA: Diagnosis not present

## 2020-03-14 DIAGNOSIS — G4700 Insomnia, unspecified: Secondary | ICD-10-CM | POA: Diagnosis not present

## 2020-03-14 DIAGNOSIS — Z20822 Contact with and (suspected) exposure to covid-19: Secondary | ICD-10-CM | POA: Diagnosis not present

## 2020-03-14 DIAGNOSIS — K5900 Constipation, unspecified: Secondary | ICD-10-CM | POA: Diagnosis not present

## 2020-03-15 DIAGNOSIS — C9201 Acute myeloblastic leukemia, in remission: Secondary | ICD-10-CM | POA: Diagnosis not present

## 2020-03-15 DIAGNOSIS — Z5111 Encounter for antineoplastic chemotherapy: Secondary | ICD-10-CM | POA: Diagnosis not present

## 2020-03-16 DIAGNOSIS — C9201 Acute myeloblastic leukemia, in remission: Secondary | ICD-10-CM | POA: Diagnosis not present

## 2020-03-16 DIAGNOSIS — Z5111 Encounter for antineoplastic chemotherapy: Secondary | ICD-10-CM | POA: Diagnosis not present

## 2020-03-17 DIAGNOSIS — Z5111 Encounter for antineoplastic chemotherapy: Secondary | ICD-10-CM | POA: Diagnosis not present

## 2020-03-17 DIAGNOSIS — C9201 Acute myeloblastic leukemia, in remission: Secondary | ICD-10-CM | POA: Diagnosis not present

## 2020-03-18 DIAGNOSIS — Z5111 Encounter for antineoplastic chemotherapy: Secondary | ICD-10-CM | POA: Diagnosis not present

## 2020-03-18 DIAGNOSIS — C9201 Acute myeloblastic leukemia, in remission: Secondary | ICD-10-CM | POA: Diagnosis not present

## 2020-03-20 DIAGNOSIS — Z5111 Encounter for antineoplastic chemotherapy: Secondary | ICD-10-CM | POA: Diagnosis not present

## 2020-03-20 DIAGNOSIS — C9201 Acute myeloblastic leukemia, in remission: Secondary | ICD-10-CM | POA: Diagnosis not present

## 2020-03-21 DIAGNOSIS — Z5111 Encounter for antineoplastic chemotherapy: Secondary | ICD-10-CM | POA: Diagnosis not present

## 2020-03-21 DIAGNOSIS — C9201 Acute myeloblastic leukemia, in remission: Secondary | ICD-10-CM | POA: Diagnosis not present

## 2020-03-28 DIAGNOSIS — C9201 Acute myeloblastic leukemia, in remission: Secondary | ICD-10-CM | POA: Diagnosis not present

## 2020-04-04 DIAGNOSIS — C9201 Acute myeloblastic leukemia, in remission: Secondary | ICD-10-CM | POA: Diagnosis not present

## 2020-04-11 DIAGNOSIS — C9201 Acute myeloblastic leukemia, in remission: Secondary | ICD-10-CM | POA: Diagnosis not present

## 2020-04-18 DIAGNOSIS — C9201 Acute myeloblastic leukemia, in remission: Secondary | ICD-10-CM | POA: Diagnosis not present

## 2020-04-25 DIAGNOSIS — Z87442 Personal history of urinary calculi: Secondary | ICD-10-CM | POA: Diagnosis not present

## 2020-04-25 DIAGNOSIS — R110 Nausea: Secondary | ICD-10-CM | POA: Diagnosis not present

## 2020-04-25 DIAGNOSIS — I313 Pericardial effusion (noninflammatory): Secondary | ICD-10-CM | POA: Diagnosis not present

## 2020-04-25 DIAGNOSIS — N951 Menopausal and female climacteric states: Secondary | ICD-10-CM | POA: Diagnosis not present

## 2020-04-25 DIAGNOSIS — R1013 Epigastric pain: Secondary | ICD-10-CM | POA: Diagnosis not present

## 2020-04-25 DIAGNOSIS — C9201 Acute myeloblastic leukemia, in remission: Secondary | ICD-10-CM | POA: Diagnosis not present

## 2020-04-25 DIAGNOSIS — Z95828 Presence of other vascular implants and grafts: Secondary | ICD-10-CM | POA: Diagnosis not present

## 2020-04-25 DIAGNOSIS — F329 Major depressive disorder, single episode, unspecified: Secondary | ICD-10-CM | POA: Diagnosis not present

## 2020-04-25 DIAGNOSIS — Z79899 Other long term (current) drug therapy: Secondary | ICD-10-CM | POA: Diagnosis not present

## 2020-04-25 DIAGNOSIS — K5900 Constipation, unspecified: Secondary | ICD-10-CM | POA: Diagnosis not present

## 2020-04-25 DIAGNOSIS — Z959 Presence of cardiac and vascular implant and graft, unspecified: Secondary | ICD-10-CM | POA: Diagnosis not present

## 2020-04-25 DIAGNOSIS — Z5111 Encounter for antineoplastic chemotherapy: Secondary | ICD-10-CM | POA: Diagnosis not present

## 2020-04-25 DIAGNOSIS — G4700 Insomnia, unspecified: Secondary | ICD-10-CM | POA: Diagnosis not present

## 2020-04-26 DIAGNOSIS — Z79899 Other long term (current) drug therapy: Secondary | ICD-10-CM | POA: Diagnosis not present

## 2020-04-26 DIAGNOSIS — C9201 Acute myeloblastic leukemia, in remission: Secondary | ICD-10-CM | POA: Diagnosis not present

## 2020-04-26 DIAGNOSIS — Z5111 Encounter for antineoplastic chemotherapy: Secondary | ICD-10-CM | POA: Diagnosis not present

## 2020-04-27 DIAGNOSIS — Z5111 Encounter for antineoplastic chemotherapy: Secondary | ICD-10-CM | POA: Diagnosis not present

## 2020-04-27 DIAGNOSIS — Z79899 Other long term (current) drug therapy: Secondary | ICD-10-CM | POA: Diagnosis not present

## 2020-04-27 DIAGNOSIS — C9201 Acute myeloblastic leukemia, in remission: Secondary | ICD-10-CM | POA: Diagnosis not present

## 2020-04-28 DIAGNOSIS — C9201 Acute myeloblastic leukemia, in remission: Secondary | ICD-10-CM | POA: Diagnosis not present

## 2020-04-28 DIAGNOSIS — Z79899 Other long term (current) drug therapy: Secondary | ICD-10-CM | POA: Diagnosis not present

## 2020-04-28 DIAGNOSIS — Z5111 Encounter for antineoplastic chemotherapy: Secondary | ICD-10-CM | POA: Diagnosis not present

## 2020-04-29 DIAGNOSIS — Z5111 Encounter for antineoplastic chemotherapy: Secondary | ICD-10-CM | POA: Diagnosis not present

## 2020-04-29 DIAGNOSIS — C9201 Acute myeloblastic leukemia, in remission: Secondary | ICD-10-CM | POA: Diagnosis not present

## 2020-04-29 DIAGNOSIS — Z79899 Other long term (current) drug therapy: Secondary | ICD-10-CM | POA: Diagnosis not present

## 2020-05-01 DIAGNOSIS — Z79899 Other long term (current) drug therapy: Secondary | ICD-10-CM | POA: Diagnosis not present

## 2020-05-01 DIAGNOSIS — Z5111 Encounter for antineoplastic chemotherapy: Secondary | ICD-10-CM | POA: Diagnosis not present

## 2020-05-01 DIAGNOSIS — C9201 Acute myeloblastic leukemia, in remission: Secondary | ICD-10-CM | POA: Diagnosis not present

## 2020-05-02 DIAGNOSIS — Z79899 Other long term (current) drug therapy: Secondary | ICD-10-CM | POA: Diagnosis not present

## 2020-05-02 DIAGNOSIS — Z5111 Encounter for antineoplastic chemotherapy: Secondary | ICD-10-CM | POA: Diagnosis not present

## 2020-05-02 DIAGNOSIS — C9201 Acute myeloblastic leukemia, in remission: Secondary | ICD-10-CM | POA: Diagnosis not present

## 2020-05-09 DIAGNOSIS — C9201 Acute myeloblastic leukemia, in remission: Secondary | ICD-10-CM | POA: Diagnosis not present

## 2020-05-16 DIAGNOSIS — C9201 Acute myeloblastic leukemia, in remission: Secondary | ICD-10-CM | POA: Diagnosis not present

## 2020-05-23 DIAGNOSIS — C9201 Acute myeloblastic leukemia, in remission: Secondary | ICD-10-CM | POA: Diagnosis not present

## 2020-05-23 DIAGNOSIS — Z23 Encounter for immunization: Secondary | ICD-10-CM | POA: Diagnosis not present

## 2020-05-30 DIAGNOSIS — C9201 Acute myeloblastic leukemia, in remission: Secondary | ICD-10-CM | POA: Diagnosis not present

## 2020-06-06 DIAGNOSIS — R110 Nausea: Secondary | ICD-10-CM | POA: Diagnosis not present

## 2020-06-06 DIAGNOSIS — G4700 Insomnia, unspecified: Secondary | ICD-10-CM | POA: Diagnosis not present

## 2020-06-06 DIAGNOSIS — Z5111 Encounter for antineoplastic chemotherapy: Secondary | ICD-10-CM | POA: Diagnosis not present

## 2020-06-06 DIAGNOSIS — C9201 Acute myeloblastic leukemia, in remission: Secondary | ICD-10-CM | POA: Diagnosis not present

## 2020-06-09 DIAGNOSIS — Z79899 Other long term (current) drug therapy: Secondary | ICD-10-CM | POA: Diagnosis not present

## 2020-06-09 DIAGNOSIS — Z5111 Encounter for antineoplastic chemotherapy: Secondary | ICD-10-CM | POA: Diagnosis not present

## 2020-06-09 DIAGNOSIS — C9201 Acute myeloblastic leukemia, in remission: Secondary | ICD-10-CM | POA: Diagnosis not present

## 2020-06-10 DIAGNOSIS — Z79899 Other long term (current) drug therapy: Secondary | ICD-10-CM | POA: Diagnosis not present

## 2020-06-10 DIAGNOSIS — C9201 Acute myeloblastic leukemia, in remission: Secondary | ICD-10-CM | POA: Diagnosis not present

## 2020-06-10 DIAGNOSIS — Z5111 Encounter for antineoplastic chemotherapy: Secondary | ICD-10-CM | POA: Diagnosis not present

## 2020-06-12 ENCOUNTER — Other Ambulatory Visit: Payer: Self-pay | Admitting: Internal Medicine

## 2020-06-12 DIAGNOSIS — Z1231 Encounter for screening mammogram for malignant neoplasm of breast: Secondary | ICD-10-CM

## 2020-06-12 DIAGNOSIS — Z5111 Encounter for antineoplastic chemotherapy: Secondary | ICD-10-CM | POA: Diagnosis not present

## 2020-06-12 DIAGNOSIS — C9201 Acute myeloblastic leukemia, in remission: Secondary | ICD-10-CM | POA: Diagnosis not present

## 2020-06-12 DIAGNOSIS — Z79899 Other long term (current) drug therapy: Secondary | ICD-10-CM | POA: Diagnosis not present

## 2020-06-15 ENCOUNTER — Ambulatory Visit
Admission: RE | Admit: 2020-06-15 | Discharge: 2020-06-15 | Disposition: A | Discharge status: Home / Self Care | Payer: Medicare Other | Source: Ambulatory Visit | Attending: Internal Medicine | Admitting: Internal Medicine

## 2020-06-15 ENCOUNTER — Other Ambulatory Visit: Payer: Self-pay

## 2020-06-15 DIAGNOSIS — Z1231 Encounter for screening mammogram for malignant neoplasm of breast: Secondary | ICD-10-CM | POA: Diagnosis not present

## 2020-06-20 DIAGNOSIS — C9201 Acute myeloblastic leukemia, in remission: Secondary | ICD-10-CM | POA: Diagnosis not present

## 2020-06-27 DIAGNOSIS — C9201 Acute myeloblastic leukemia, in remission: Secondary | ICD-10-CM | POA: Diagnosis not present

## 2020-07-04 DIAGNOSIS — C9201 Acute myeloblastic leukemia, in remission: Secondary | ICD-10-CM | POA: Diagnosis not present

## 2020-07-11 DIAGNOSIS — C9201 Acute myeloblastic leukemia, in remission: Secondary | ICD-10-CM | POA: Diagnosis not present

## 2020-07-18 DIAGNOSIS — Z87442 Personal history of urinary calculi: Secondary | ICD-10-CM | POA: Diagnosis not present

## 2020-07-18 DIAGNOSIS — Z79899 Other long term (current) drug therapy: Secondary | ICD-10-CM | POA: Diagnosis not present

## 2020-07-18 DIAGNOSIS — R110 Nausea: Secondary | ICD-10-CM | POA: Diagnosis not present

## 2020-07-18 DIAGNOSIS — Z959 Presence of cardiac and vascular implant and graft, unspecified: Secondary | ICD-10-CM | POA: Diagnosis not present

## 2020-07-18 DIAGNOSIS — F329 Major depressive disorder, single episode, unspecified: Secondary | ICD-10-CM | POA: Diagnosis not present

## 2020-07-18 DIAGNOSIS — C9201 Acute myeloblastic leukemia, in remission: Secondary | ICD-10-CM | POA: Diagnosis not present

## 2020-07-18 DIAGNOSIS — N951 Menopausal and female climacteric states: Secondary | ICD-10-CM | POA: Diagnosis not present

## 2020-07-18 DIAGNOSIS — G4700 Insomnia, unspecified: Secondary | ICD-10-CM | POA: Diagnosis not present

## 2020-07-18 DIAGNOSIS — R1013 Epigastric pain: Secondary | ICD-10-CM | POA: Diagnosis not present

## 2020-07-18 DIAGNOSIS — Z95828 Presence of other vascular implants and grafts: Secondary | ICD-10-CM | POA: Diagnosis not present

## 2020-07-18 DIAGNOSIS — K5900 Constipation, unspecified: Secondary | ICD-10-CM | POA: Diagnosis not present

## 2020-07-18 DIAGNOSIS — I313 Pericardial effusion (noninflammatory): Secondary | ICD-10-CM | POA: Diagnosis not present

## 2020-07-21 DIAGNOSIS — Z79899 Other long term (current) drug therapy: Secondary | ICD-10-CM | POA: Diagnosis not present

## 2020-07-21 DIAGNOSIS — C9201 Acute myeloblastic leukemia, in remission: Secondary | ICD-10-CM | POA: Diagnosis not present

## 2020-07-21 DIAGNOSIS — Z5111 Encounter for antineoplastic chemotherapy: Secondary | ICD-10-CM | POA: Diagnosis not present

## 2020-07-24 DIAGNOSIS — Z5111 Encounter for antineoplastic chemotherapy: Secondary | ICD-10-CM | POA: Diagnosis not present

## 2020-07-24 DIAGNOSIS — Z79899 Other long term (current) drug therapy: Secondary | ICD-10-CM | POA: Diagnosis not present

## 2020-07-24 DIAGNOSIS — C9201 Acute myeloblastic leukemia, in remission: Secondary | ICD-10-CM | POA: Diagnosis not present

## 2020-07-25 DIAGNOSIS — Z5111 Encounter for antineoplastic chemotherapy: Secondary | ICD-10-CM | POA: Diagnosis not present

## 2020-07-25 DIAGNOSIS — C9201 Acute myeloblastic leukemia, in remission: Secondary | ICD-10-CM | POA: Diagnosis not present

## 2020-08-01 DIAGNOSIS — C9201 Acute myeloblastic leukemia, in remission: Secondary | ICD-10-CM | POA: Diagnosis not present

## 2020-08-08 DIAGNOSIS — C9000 Multiple myeloma not having achieved remission: Secondary | ICD-10-CM | POA: Diagnosis not present

## 2020-08-15 DIAGNOSIS — C9201 Acute myeloblastic leukemia, in remission: Secondary | ICD-10-CM | POA: Diagnosis not present

## 2020-08-22 DIAGNOSIS — C9201 Acute myeloblastic leukemia, in remission: Secondary | ICD-10-CM | POA: Diagnosis not present

## 2020-08-29 DIAGNOSIS — K5900 Constipation, unspecified: Secondary | ICD-10-CM | POA: Diagnosis not present

## 2020-08-29 DIAGNOSIS — R110 Nausea: Secondary | ICD-10-CM | POA: Diagnosis not present

## 2020-08-29 DIAGNOSIS — C9201 Acute myeloblastic leukemia, in remission: Secondary | ICD-10-CM | POA: Diagnosis not present

## 2020-08-29 DIAGNOSIS — Z95828 Presence of other vascular implants and grafts: Secondary | ICD-10-CM | POA: Diagnosis not present

## 2020-08-29 DIAGNOSIS — Z5111 Encounter for antineoplastic chemotherapy: Secondary | ICD-10-CM | POA: Diagnosis not present

## 2020-08-29 DIAGNOSIS — Z79899 Other long term (current) drug therapy: Secondary | ICD-10-CM | POA: Diagnosis not present

## 2020-09-01 DIAGNOSIS — Z5111 Encounter for antineoplastic chemotherapy: Secondary | ICD-10-CM | POA: Diagnosis not present

## 2020-09-01 DIAGNOSIS — Z79899 Other long term (current) drug therapy: Secondary | ICD-10-CM | POA: Diagnosis not present

## 2020-09-01 DIAGNOSIS — C9201 Acute myeloblastic leukemia, in remission: Secondary | ICD-10-CM | POA: Diagnosis not present

## 2020-09-04 DIAGNOSIS — Z79899 Other long term (current) drug therapy: Secondary | ICD-10-CM | POA: Diagnosis not present

## 2020-09-04 DIAGNOSIS — Z5111 Encounter for antineoplastic chemotherapy: Secondary | ICD-10-CM | POA: Diagnosis not present

## 2020-09-04 DIAGNOSIS — C9201 Acute myeloblastic leukemia, in remission: Secondary | ICD-10-CM | POA: Diagnosis not present

## 2020-09-12 DIAGNOSIS — C9201 Acute myeloblastic leukemia, in remission: Secondary | ICD-10-CM | POA: Diagnosis not present

## 2020-09-19 DIAGNOSIS — C9201 Acute myeloblastic leukemia, in remission: Secondary | ICD-10-CM | POA: Diagnosis not present

## 2020-09-26 DIAGNOSIS — C9201 Acute myeloblastic leukemia, in remission: Secondary | ICD-10-CM | POA: Diagnosis not present

## 2020-10-03 DIAGNOSIS — C9201 Acute myeloblastic leukemia, in remission: Secondary | ICD-10-CM | POA: Diagnosis not present

## 2020-10-10 DIAGNOSIS — C9201 Acute myeloblastic leukemia, in remission: Secondary | ICD-10-CM | POA: Diagnosis not present

## 2020-10-10 DIAGNOSIS — Z79899 Other long term (current) drug therapy: Secondary | ICD-10-CM | POA: Diagnosis not present

## 2020-10-10 DIAGNOSIS — Z95828 Presence of other vascular implants and grafts: Secondary | ICD-10-CM | POA: Diagnosis not present

## 2020-10-10 DIAGNOSIS — C9200 Acute myeloblastic leukemia, not having achieved remission: Secondary | ICD-10-CM | POA: Diagnosis not present

## 2020-10-10 DIAGNOSIS — R110 Nausea: Secondary | ICD-10-CM | POA: Diagnosis not present

## 2020-10-10 DIAGNOSIS — Z5111 Encounter for antineoplastic chemotherapy: Secondary | ICD-10-CM | POA: Diagnosis not present

## 2020-10-10 DIAGNOSIS — D709 Neutropenia, unspecified: Secondary | ICD-10-CM | POA: Diagnosis not present

## 2020-10-13 DIAGNOSIS — C9201 Acute myeloblastic leukemia, in remission: Secondary | ICD-10-CM | POA: Diagnosis not present

## 2020-10-13 DIAGNOSIS — Z79899 Other long term (current) drug therapy: Secondary | ICD-10-CM | POA: Diagnosis not present

## 2020-10-13 DIAGNOSIS — Z5111 Encounter for antineoplastic chemotherapy: Secondary | ICD-10-CM | POA: Diagnosis not present

## 2020-10-16 DIAGNOSIS — D61818 Other pancytopenia: Secondary | ICD-10-CM | POA: Diagnosis not present

## 2020-10-16 DIAGNOSIS — Z5111 Encounter for antineoplastic chemotherapy: Secondary | ICD-10-CM | POA: Diagnosis not present

## 2020-10-16 DIAGNOSIS — Z79899 Other long term (current) drug therapy: Secondary | ICD-10-CM | POA: Diagnosis not present

## 2020-10-16 DIAGNOSIS — C9201 Acute myeloblastic leukemia, in remission: Secondary | ICD-10-CM | POA: Diagnosis not present

## 2020-10-24 DIAGNOSIS — C9201 Acute myeloblastic leukemia, in remission: Secondary | ICD-10-CM | POA: Diagnosis not present

## 2020-10-31 DIAGNOSIS — C9201 Acute myeloblastic leukemia, in remission: Secondary | ICD-10-CM | POA: Diagnosis not present

## 2020-11-07 DIAGNOSIS — C9201 Acute myeloblastic leukemia, in remission: Secondary | ICD-10-CM | POA: Diagnosis not present

## 2020-11-14 DIAGNOSIS — C9201 Acute myeloblastic leukemia, in remission: Secondary | ICD-10-CM | POA: Diagnosis not present

## 2020-11-21 DIAGNOSIS — R110 Nausea: Secondary | ICD-10-CM | POA: Diagnosis not present

## 2020-11-21 DIAGNOSIS — Z5111 Encounter for antineoplastic chemotherapy: Secondary | ICD-10-CM | POA: Diagnosis not present

## 2020-11-21 DIAGNOSIS — Z95828 Presence of other vascular implants and grafts: Secondary | ICD-10-CM | POA: Diagnosis not present

## 2020-11-21 DIAGNOSIS — Z79899 Other long term (current) drug therapy: Secondary | ICD-10-CM | POA: Diagnosis not present

## 2020-11-21 DIAGNOSIS — C9201 Acute myeloblastic leukemia, in remission: Secondary | ICD-10-CM | POA: Diagnosis not present

## 2020-11-21 DIAGNOSIS — D61818 Other pancytopenia: Secondary | ICD-10-CM | POA: Diagnosis not present

## 2020-11-24 DIAGNOSIS — Z79899 Other long term (current) drug therapy: Secondary | ICD-10-CM | POA: Diagnosis not present

## 2020-11-24 DIAGNOSIS — Z5111 Encounter for antineoplastic chemotherapy: Secondary | ICD-10-CM | POA: Diagnosis not present

## 2020-11-24 DIAGNOSIS — C9201 Acute myeloblastic leukemia, in remission: Secondary | ICD-10-CM | POA: Diagnosis not present

## 2020-11-27 DIAGNOSIS — Z5111 Encounter for antineoplastic chemotherapy: Secondary | ICD-10-CM | POA: Diagnosis not present

## 2020-11-27 DIAGNOSIS — Z79899 Other long term (current) drug therapy: Secondary | ICD-10-CM | POA: Diagnosis not present

## 2020-11-27 DIAGNOSIS — C9201 Acute myeloblastic leukemia, in remission: Secondary | ICD-10-CM | POA: Diagnosis not present

## 2020-12-05 DIAGNOSIS — C9201 Acute myeloblastic leukemia, in remission: Secondary | ICD-10-CM | POA: Diagnosis not present

## 2020-12-12 DIAGNOSIS — C9201 Acute myeloblastic leukemia, in remission: Secondary | ICD-10-CM | POA: Diagnosis not present

## 2020-12-15 DIAGNOSIS — C9200 Acute myeloblastic leukemia, not having achieved remission: Secondary | ICD-10-CM | POA: Diagnosis not present

## 2020-12-15 DIAGNOSIS — D6949 Other primary thrombocytopenia: Secondary | ICD-10-CM | POA: Diagnosis not present

## 2020-12-15 DIAGNOSIS — D7589 Other specified diseases of blood and blood-forming organs: Secondary | ICD-10-CM | POA: Diagnosis not present

## 2020-12-18 DIAGNOSIS — Z01818 Encounter for other preprocedural examination: Secondary | ICD-10-CM | POA: Diagnosis not present

## 2020-12-18 DIAGNOSIS — C9200 Acute myeloblastic leukemia, not having achieved remission: Secondary | ICD-10-CM | POA: Diagnosis not present

## 2020-12-19 DIAGNOSIS — C9201 Acute myeloblastic leukemia, in remission: Secondary | ICD-10-CM | POA: Diagnosis not present

## 2020-12-26 DIAGNOSIS — C9201 Acute myeloblastic leukemia, in remission: Secondary | ICD-10-CM | POA: Diagnosis not present

## 2021-01-02 DIAGNOSIS — R109 Unspecified abdominal pain: Secondary | ICD-10-CM | POA: Diagnosis not present

## 2021-01-02 DIAGNOSIS — F0631 Mood disorder due to known physiological condition with depressive features: Secondary | ICD-10-CM | POA: Diagnosis not present

## 2021-01-02 DIAGNOSIS — D709 Neutropenia, unspecified: Secondary | ICD-10-CM | POA: Diagnosis not present

## 2021-01-02 DIAGNOSIS — R142 Eructation: Secondary | ICD-10-CM | POA: Diagnosis not present

## 2021-01-02 DIAGNOSIS — C9201 Acute myeloblastic leukemia, in remission: Secondary | ICD-10-CM | POA: Diagnosis not present

## 2021-01-02 DIAGNOSIS — K5900 Constipation, unspecified: Secondary | ICD-10-CM | POA: Diagnosis not present

## 2021-01-02 DIAGNOSIS — Z95828 Presence of other vascular implants and grafts: Secondary | ICD-10-CM | POA: Diagnosis not present

## 2021-01-02 DIAGNOSIS — D61818 Other pancytopenia: Secondary | ICD-10-CM | POA: Diagnosis not present

## 2021-01-02 DIAGNOSIS — Z792 Long term (current) use of antibiotics: Secondary | ICD-10-CM | POA: Diagnosis not present

## 2021-01-02 DIAGNOSIS — Z79899 Other long term (current) drug therapy: Secondary | ICD-10-CM | POA: Diagnosis not present

## 2021-01-02 DIAGNOSIS — S51811A Laceration without foreign body of right forearm, initial encounter: Secondary | ICD-10-CM | POA: Diagnosis not present

## 2021-01-02 DIAGNOSIS — R143 Flatulence: Secondary | ICD-10-CM | POA: Diagnosis not present

## 2021-01-02 DIAGNOSIS — R232 Flushing: Secondary | ICD-10-CM | POA: Diagnosis not present

## 2021-01-02 DIAGNOSIS — Z87898 Personal history of other specified conditions: Secondary | ICD-10-CM | POA: Diagnosis not present

## 2021-01-02 DIAGNOSIS — R110 Nausea: Secondary | ICD-10-CM | POA: Diagnosis not present

## 2021-01-02 DIAGNOSIS — C9200 Acute myeloblastic leukemia, not having achieved remission: Secondary | ICD-10-CM | POA: Diagnosis not present

## 2021-01-02 DIAGNOSIS — Z5111 Encounter for antineoplastic chemotherapy: Secondary | ICD-10-CM | POA: Diagnosis not present

## 2021-01-05 DIAGNOSIS — C9201 Acute myeloblastic leukemia, in remission: Secondary | ICD-10-CM | POA: Diagnosis not present

## 2021-01-05 DIAGNOSIS — Z5111 Encounter for antineoplastic chemotherapy: Secondary | ICD-10-CM | POA: Diagnosis not present

## 2021-01-05 DIAGNOSIS — Z79899 Other long term (current) drug therapy: Secondary | ICD-10-CM | POA: Diagnosis not present

## 2021-01-08 DIAGNOSIS — C9201 Acute myeloblastic leukemia, in remission: Secondary | ICD-10-CM | POA: Diagnosis not present

## 2021-01-08 DIAGNOSIS — Z79899 Other long term (current) drug therapy: Secondary | ICD-10-CM | POA: Diagnosis not present

## 2021-01-08 DIAGNOSIS — Z5111 Encounter for antineoplastic chemotherapy: Secondary | ICD-10-CM | POA: Diagnosis not present

## 2021-01-09 DIAGNOSIS — Z79899 Other long term (current) drug therapy: Secondary | ICD-10-CM | POA: Diagnosis not present

## 2021-01-09 DIAGNOSIS — Z5111 Encounter for antineoplastic chemotherapy: Secondary | ICD-10-CM | POA: Diagnosis not present

## 2021-01-09 DIAGNOSIS — C9201 Acute myeloblastic leukemia, in remission: Secondary | ICD-10-CM | POA: Diagnosis not present

## 2021-01-23 DIAGNOSIS — C9201 Acute myeloblastic leukemia, in remission: Secondary | ICD-10-CM | POA: Diagnosis not present

## 2021-01-29 DIAGNOSIS — C9200 Acute myeloblastic leukemia, not having achieved remission: Secondary | ICD-10-CM | POA: Diagnosis not present

## 2021-02-13 DIAGNOSIS — R232 Flushing: Secondary | ICD-10-CM | POA: Diagnosis not present

## 2021-02-13 DIAGNOSIS — Z79899 Other long term (current) drug therapy: Secondary | ICD-10-CM | POA: Diagnosis not present

## 2021-02-13 DIAGNOSIS — I313 Pericardial effusion (noninflammatory): Secondary | ICD-10-CM | POA: Diagnosis not present

## 2021-02-13 DIAGNOSIS — R110 Nausea: Secondary | ICD-10-CM | POA: Diagnosis not present

## 2021-02-13 DIAGNOSIS — Z5111 Encounter for antineoplastic chemotherapy: Secondary | ICD-10-CM | POA: Diagnosis not present

## 2021-02-13 DIAGNOSIS — C9201 Acute myeloblastic leukemia, in remission: Secondary | ICD-10-CM | POA: Diagnosis not present

## 2021-02-13 DIAGNOSIS — D709 Neutropenia, unspecified: Secondary | ICD-10-CM | POA: Diagnosis not present

## 2021-02-13 DIAGNOSIS — F32A Depression, unspecified: Secondary | ICD-10-CM | POA: Diagnosis not present

## 2021-02-13 DIAGNOSIS — Z85828 Personal history of other malignant neoplasm of skin: Secondary | ICD-10-CM | POA: Diagnosis not present

## 2021-02-13 DIAGNOSIS — D61818 Other pancytopenia: Secondary | ICD-10-CM | POA: Diagnosis not present

## 2021-02-13 DIAGNOSIS — K5900 Constipation, unspecified: Secondary | ICD-10-CM | POA: Diagnosis not present

## 2021-02-13 DIAGNOSIS — Z87442 Personal history of urinary calculi: Secondary | ICD-10-CM | POA: Diagnosis not present

## 2021-02-16 DIAGNOSIS — C9201 Acute myeloblastic leukemia, in remission: Secondary | ICD-10-CM | POA: Diagnosis not present

## 2021-02-16 DIAGNOSIS — Z5111 Encounter for antineoplastic chemotherapy: Secondary | ICD-10-CM | POA: Diagnosis not present

## 2021-02-19 DIAGNOSIS — Z79899 Other long term (current) drug therapy: Secondary | ICD-10-CM | POA: Diagnosis not present

## 2021-02-19 DIAGNOSIS — Z5111 Encounter for antineoplastic chemotherapy: Secondary | ICD-10-CM | POA: Diagnosis not present

## 2021-02-19 DIAGNOSIS — C9201 Acute myeloblastic leukemia, in remission: Secondary | ICD-10-CM | POA: Diagnosis not present

## 2021-02-20 DIAGNOSIS — Z5111 Encounter for antineoplastic chemotherapy: Secondary | ICD-10-CM | POA: Diagnosis not present

## 2021-02-20 DIAGNOSIS — C9201 Acute myeloblastic leukemia, in remission: Secondary | ICD-10-CM | POA: Diagnosis not present

## 2021-02-20 DIAGNOSIS — Z79899 Other long term (current) drug therapy: Secondary | ICD-10-CM | POA: Diagnosis not present

## 2021-02-21 DIAGNOSIS — H5213 Myopia, bilateral: Secondary | ICD-10-CM | POA: Diagnosis not present

## 2021-02-27 DIAGNOSIS — C9201 Acute myeloblastic leukemia, in remission: Secondary | ICD-10-CM | POA: Diagnosis not present

## 2021-03-06 DIAGNOSIS — C9201 Acute myeloblastic leukemia, in remission: Secondary | ICD-10-CM | POA: Diagnosis not present

## 2021-03-13 DIAGNOSIS — C9201 Acute myeloblastic leukemia, in remission: Secondary | ICD-10-CM | POA: Diagnosis not present

## 2021-03-20 DIAGNOSIS — C9201 Acute myeloblastic leukemia, in remission: Secondary | ICD-10-CM | POA: Diagnosis not present

## 2021-03-27 DIAGNOSIS — C9201 Acute myeloblastic leukemia, in remission: Secondary | ICD-10-CM | POA: Diagnosis not present

## 2021-03-27 DIAGNOSIS — Z87442 Personal history of urinary calculi: Secondary | ICD-10-CM | POA: Diagnosis not present

## 2021-03-27 DIAGNOSIS — Z85828 Personal history of other malignant neoplasm of skin: Secondary | ICD-10-CM | POA: Diagnosis not present

## 2021-03-27 DIAGNOSIS — R110 Nausea: Secondary | ICD-10-CM | POA: Diagnosis not present

## 2021-03-27 DIAGNOSIS — D709 Neutropenia, unspecified: Secondary | ICD-10-CM | POA: Diagnosis not present

## 2021-03-27 DIAGNOSIS — F32A Depression, unspecified: Secondary | ICD-10-CM | POA: Diagnosis not present

## 2021-03-27 DIAGNOSIS — R232 Flushing: Secondary | ICD-10-CM | POA: Diagnosis not present

## 2021-03-27 DIAGNOSIS — K5900 Constipation, unspecified: Secondary | ICD-10-CM | POA: Diagnosis not present

## 2021-03-27 DIAGNOSIS — S51811S Laceration without foreign body of right forearm, sequela: Secondary | ICD-10-CM | POA: Diagnosis not present

## 2021-03-27 DIAGNOSIS — I313 Pericardial effusion (noninflammatory): Secondary | ICD-10-CM | POA: Diagnosis not present

## 2021-03-27 DIAGNOSIS — D61818 Other pancytopenia: Secondary | ICD-10-CM | POA: Diagnosis not present

## 2021-03-27 DIAGNOSIS — Z5111 Encounter for antineoplastic chemotherapy: Secondary | ICD-10-CM | POA: Diagnosis not present

## 2021-03-30 DIAGNOSIS — C9201 Acute myeloblastic leukemia, in remission: Secondary | ICD-10-CM | POA: Diagnosis not present

## 2021-03-30 DIAGNOSIS — Z5111 Encounter for antineoplastic chemotherapy: Secondary | ICD-10-CM | POA: Diagnosis not present

## 2021-04-02 DIAGNOSIS — C9201 Acute myeloblastic leukemia, in remission: Secondary | ICD-10-CM | POA: Diagnosis not present

## 2021-04-02 DIAGNOSIS — Z5111 Encounter for antineoplastic chemotherapy: Secondary | ICD-10-CM | POA: Diagnosis not present

## 2021-04-03 DIAGNOSIS — Z5111 Encounter for antineoplastic chemotherapy: Secondary | ICD-10-CM | POA: Diagnosis not present

## 2021-04-03 DIAGNOSIS — C9201 Acute myeloblastic leukemia, in remission: Secondary | ICD-10-CM | POA: Diagnosis not present

## 2021-04-10 DIAGNOSIS — C9201 Acute myeloblastic leukemia, in remission: Secondary | ICD-10-CM | POA: Diagnosis not present

## 2021-04-17 DIAGNOSIS — C9201 Acute myeloblastic leukemia, in remission: Secondary | ICD-10-CM | POA: Diagnosis not present

## 2021-04-24 DIAGNOSIS — C9201 Acute myeloblastic leukemia, in remission: Secondary | ICD-10-CM | POA: Diagnosis not present

## 2021-05-01 DIAGNOSIS — C9201 Acute myeloblastic leukemia, in remission: Secondary | ICD-10-CM | POA: Diagnosis not present

## 2021-05-08 DIAGNOSIS — Z79899 Other long term (current) drug therapy: Secondary | ICD-10-CM | POA: Diagnosis not present

## 2021-05-08 DIAGNOSIS — Z23 Encounter for immunization: Secondary | ICD-10-CM | POA: Diagnosis not present

## 2021-05-08 DIAGNOSIS — C9201 Acute myeloblastic leukemia, in remission: Secondary | ICD-10-CM | POA: Diagnosis not present

## 2021-05-08 DIAGNOSIS — Z5111 Encounter for antineoplastic chemotherapy: Secondary | ICD-10-CM | POA: Diagnosis not present

## 2021-05-08 DIAGNOSIS — D61818 Other pancytopenia: Secondary | ICD-10-CM | POA: Diagnosis not present

## 2021-05-08 DIAGNOSIS — F32A Depression, unspecified: Secondary | ICD-10-CM | POA: Diagnosis not present

## 2021-05-08 DIAGNOSIS — Z95828 Presence of other vascular implants and grafts: Secondary | ICD-10-CM | POA: Diagnosis not present

## 2021-05-11 DIAGNOSIS — Z95828 Presence of other vascular implants and grafts: Secondary | ICD-10-CM | POA: Diagnosis not present

## 2021-05-11 DIAGNOSIS — Z5111 Encounter for antineoplastic chemotherapy: Secondary | ICD-10-CM | POA: Diagnosis not present

## 2021-05-11 DIAGNOSIS — C9201 Acute myeloblastic leukemia, in remission: Secondary | ICD-10-CM | POA: Diagnosis not present

## 2021-05-14 DIAGNOSIS — Z95828 Presence of other vascular implants and grafts: Secondary | ICD-10-CM | POA: Diagnosis not present

## 2021-05-14 DIAGNOSIS — C9201 Acute myeloblastic leukemia, in remission: Secondary | ICD-10-CM | POA: Diagnosis not present

## 2021-05-14 DIAGNOSIS — Z5111 Encounter for antineoplastic chemotherapy: Secondary | ICD-10-CM | POA: Diagnosis not present

## 2021-05-22 DIAGNOSIS — C9201 Acute myeloblastic leukemia, in remission: Secondary | ICD-10-CM | POA: Diagnosis not present

## 2021-05-29 DIAGNOSIS — C9201 Acute myeloblastic leukemia, in remission: Secondary | ICD-10-CM | POA: Diagnosis not present

## 2021-06-05 DIAGNOSIS — C9201 Acute myeloblastic leukemia, in remission: Secondary | ICD-10-CM | POA: Diagnosis not present

## 2021-06-12 DIAGNOSIS — C9201 Acute myeloblastic leukemia, in remission: Secondary | ICD-10-CM | POA: Diagnosis not present

## 2021-06-19 DIAGNOSIS — D709 Neutropenia, unspecified: Secondary | ICD-10-CM | POA: Diagnosis not present

## 2021-06-19 DIAGNOSIS — D61818 Other pancytopenia: Secondary | ICD-10-CM | POA: Diagnosis not present

## 2021-06-19 DIAGNOSIS — F32A Depression, unspecified: Secondary | ICD-10-CM | POA: Diagnosis not present

## 2021-06-19 DIAGNOSIS — R110 Nausea: Secondary | ICD-10-CM | POA: Diagnosis not present

## 2021-06-19 DIAGNOSIS — C9201 Acute myeloblastic leukemia, in remission: Secondary | ICD-10-CM | POA: Diagnosis not present

## 2021-06-19 DIAGNOSIS — R232 Flushing: Secondary | ICD-10-CM | POA: Diagnosis not present

## 2021-06-19 DIAGNOSIS — K5900 Constipation, unspecified: Secondary | ICD-10-CM | POA: Diagnosis not present

## 2021-06-22 DIAGNOSIS — Z5111 Encounter for antineoplastic chemotherapy: Secondary | ICD-10-CM | POA: Diagnosis not present

## 2021-06-22 DIAGNOSIS — C9201 Acute myeloblastic leukemia, in remission: Secondary | ICD-10-CM | POA: Diagnosis not present

## 2021-06-25 DIAGNOSIS — C9201 Acute myeloblastic leukemia, in remission: Secondary | ICD-10-CM | POA: Diagnosis not present

## 2021-06-25 DIAGNOSIS — Z5111 Encounter for antineoplastic chemotherapy: Secondary | ICD-10-CM | POA: Diagnosis not present

## 2021-07-10 DIAGNOSIS — C9201 Acute myeloblastic leukemia, in remission: Secondary | ICD-10-CM | POA: Diagnosis not present

## 2021-07-24 DIAGNOSIS — C9201 Acute myeloblastic leukemia, in remission: Secondary | ICD-10-CM | POA: Diagnosis not present

## 2021-07-31 DIAGNOSIS — R232 Flushing: Secondary | ICD-10-CM | POA: Diagnosis not present

## 2021-07-31 DIAGNOSIS — D61818 Other pancytopenia: Secondary | ICD-10-CM | POA: Diagnosis not present

## 2021-07-31 DIAGNOSIS — K5900 Constipation, unspecified: Secondary | ICD-10-CM | POA: Diagnosis not present

## 2021-07-31 DIAGNOSIS — F32A Depression, unspecified: Secondary | ICD-10-CM | POA: Diagnosis not present

## 2021-07-31 DIAGNOSIS — Z95828 Presence of other vascular implants and grafts: Secondary | ICD-10-CM | POA: Diagnosis not present

## 2021-07-31 DIAGNOSIS — R110 Nausea: Secondary | ICD-10-CM | POA: Diagnosis not present

## 2021-07-31 DIAGNOSIS — D709 Neutropenia, unspecified: Secondary | ICD-10-CM | POA: Diagnosis not present

## 2021-07-31 DIAGNOSIS — Z5111 Encounter for antineoplastic chemotherapy: Secondary | ICD-10-CM | POA: Diagnosis not present

## 2021-07-31 DIAGNOSIS — C9201 Acute myeloblastic leukemia, in remission: Secondary | ICD-10-CM | POA: Diagnosis not present

## 2021-07-31 DIAGNOSIS — Z79899 Other long term (current) drug therapy: Secondary | ICD-10-CM | POA: Diagnosis not present

## 2021-08-03 DIAGNOSIS — C9201 Acute myeloblastic leukemia, in remission: Secondary | ICD-10-CM | POA: Diagnosis not present

## 2021-08-03 DIAGNOSIS — D61818 Other pancytopenia: Secondary | ICD-10-CM | POA: Diagnosis not present

## 2021-08-03 DIAGNOSIS — Z95828 Presence of other vascular implants and grafts: Secondary | ICD-10-CM | POA: Diagnosis not present

## 2021-08-03 DIAGNOSIS — Z5111 Encounter for antineoplastic chemotherapy: Secondary | ICD-10-CM | POA: Diagnosis not present

## 2021-08-07 DIAGNOSIS — C9201 Acute myeloblastic leukemia, in remission: Secondary | ICD-10-CM | POA: Diagnosis not present

## 2021-08-07 DIAGNOSIS — Z5111 Encounter for antineoplastic chemotherapy: Secondary | ICD-10-CM | POA: Diagnosis not present

## 2021-08-14 DIAGNOSIS — C9201 Acute myeloblastic leukemia, in remission: Secondary | ICD-10-CM | POA: Diagnosis not present

## 2021-08-21 DIAGNOSIS — C9201 Acute myeloblastic leukemia, in remission: Secondary | ICD-10-CM | POA: Diagnosis not present

## 2021-08-28 DIAGNOSIS — C9201 Acute myeloblastic leukemia, in remission: Secondary | ICD-10-CM | POA: Diagnosis not present

## 2021-08-28 DIAGNOSIS — Z79899 Other long term (current) drug therapy: Secondary | ICD-10-CM | POA: Diagnosis not present

## 2021-09-02 DIAGNOSIS — C9201 Acute myeloblastic leukemia, in remission: Secondary | ICD-10-CM | POA: Diagnosis not present

## 2021-09-04 DIAGNOSIS — Z5111 Encounter for antineoplastic chemotherapy: Secondary | ICD-10-CM | POA: Diagnosis not present

## 2021-09-04 DIAGNOSIS — R7989 Other specified abnormal findings of blood chemistry: Secondary | ICD-10-CM | POA: Diagnosis not present

## 2021-09-04 DIAGNOSIS — Z79899 Other long term (current) drug therapy: Secondary | ICD-10-CM | POA: Diagnosis not present

## 2021-09-04 DIAGNOSIS — C9201 Acute myeloblastic leukemia, in remission: Secondary | ICD-10-CM | POA: Diagnosis not present

## 2021-09-04 DIAGNOSIS — Z5181 Encounter for therapeutic drug level monitoring: Secondary | ICD-10-CM | POA: Diagnosis not present

## 2021-09-05 DIAGNOSIS — C9201 Acute myeloblastic leukemia, in remission: Secondary | ICD-10-CM | POA: Diagnosis not present

## 2021-09-05 DIAGNOSIS — Z5111 Encounter for antineoplastic chemotherapy: Secondary | ICD-10-CM | POA: Diagnosis not present

## 2021-09-06 DIAGNOSIS — Z5111 Encounter for antineoplastic chemotherapy: Secondary | ICD-10-CM | POA: Diagnosis not present

## 2021-09-06 DIAGNOSIS — C9201 Acute myeloblastic leukemia, in remission: Secondary | ICD-10-CM | POA: Diagnosis not present

## 2021-09-07 DIAGNOSIS — C9201 Acute myeloblastic leukemia, in remission: Secondary | ICD-10-CM | POA: Diagnosis not present

## 2021-09-07 DIAGNOSIS — Z5111 Encounter for antineoplastic chemotherapy: Secondary | ICD-10-CM | POA: Diagnosis not present

## 2021-09-08 DIAGNOSIS — Z5111 Encounter for antineoplastic chemotherapy: Secondary | ICD-10-CM | POA: Diagnosis not present

## 2021-09-08 DIAGNOSIS — C9201 Acute myeloblastic leukemia, in remission: Secondary | ICD-10-CM | POA: Diagnosis not present

## 2021-09-10 DIAGNOSIS — C9201 Acute myeloblastic leukemia, in remission: Secondary | ICD-10-CM | POA: Diagnosis not present

## 2021-09-10 DIAGNOSIS — Z5111 Encounter for antineoplastic chemotherapy: Secondary | ICD-10-CM | POA: Diagnosis not present

## 2021-09-11 DIAGNOSIS — Z5111 Encounter for antineoplastic chemotherapy: Secondary | ICD-10-CM | POA: Diagnosis not present

## 2021-09-11 DIAGNOSIS — C9201 Acute myeloblastic leukemia, in remission: Secondary | ICD-10-CM | POA: Diagnosis not present

## 2021-09-14 DIAGNOSIS — C9201 Acute myeloblastic leukemia, in remission: Secondary | ICD-10-CM | POA: Diagnosis not present

## 2021-09-14 DIAGNOSIS — D61818 Other pancytopenia: Secondary | ICD-10-CM | POA: Diagnosis not present

## 2021-09-14 DIAGNOSIS — Z79899 Other long term (current) drug therapy: Secondary | ICD-10-CM | POA: Diagnosis not present

## 2021-09-18 DIAGNOSIS — R7989 Other specified abnormal findings of blood chemistry: Secondary | ICD-10-CM | POA: Diagnosis not present

## 2021-09-18 DIAGNOSIS — C9202 Acute myeloblastic leukemia, in relapse: Secondary | ICD-10-CM | POA: Diagnosis not present

## 2021-09-21 DIAGNOSIS — C9202 Acute myeloblastic leukemia, in relapse: Secondary | ICD-10-CM | POA: Diagnosis not present

## 2021-09-21 DIAGNOSIS — R7989 Other specified abnormal findings of blood chemistry: Secondary | ICD-10-CM | POA: Diagnosis not present

## 2021-09-25 DIAGNOSIS — C9200 Acute myeloblastic leukemia, not having achieved remission: Secondary | ICD-10-CM | POA: Diagnosis not present

## 2021-09-25 DIAGNOSIS — R7989 Other specified abnormal findings of blood chemistry: Secondary | ICD-10-CM | POA: Diagnosis not present

## 2021-09-28 DIAGNOSIS — R7989 Other specified abnormal findings of blood chemistry: Secondary | ICD-10-CM | POA: Diagnosis not present

## 2021-09-28 DIAGNOSIS — C9202 Acute myeloblastic leukemia, in relapse: Secondary | ICD-10-CM | POA: Diagnosis not present

## 2021-10-02 DIAGNOSIS — Z862 Personal history of diseases of the blood and blood-forming organs and certain disorders involving the immune mechanism: Secondary | ICD-10-CM | POA: Diagnosis not present

## 2021-10-02 DIAGNOSIS — C9202 Acute myeloblastic leukemia, in relapse: Secondary | ICD-10-CM | POA: Diagnosis not present

## 2021-10-02 DIAGNOSIS — Z5111 Encounter for antineoplastic chemotherapy: Secondary | ICD-10-CM | POA: Diagnosis not present

## 2021-10-03 DIAGNOSIS — C9202 Acute myeloblastic leukemia, in relapse: Secondary | ICD-10-CM | POA: Diagnosis not present

## 2021-10-03 DIAGNOSIS — Z5111 Encounter for antineoplastic chemotherapy: Secondary | ICD-10-CM | POA: Diagnosis not present

## 2021-10-04 DIAGNOSIS — Z5111 Encounter for antineoplastic chemotherapy: Secondary | ICD-10-CM | POA: Diagnosis not present

## 2021-10-04 DIAGNOSIS — C9202 Acute myeloblastic leukemia, in relapse: Secondary | ICD-10-CM | POA: Diagnosis not present

## 2021-10-05 DIAGNOSIS — C9202 Acute myeloblastic leukemia, in relapse: Secondary | ICD-10-CM | POA: Diagnosis not present

## 2021-10-05 DIAGNOSIS — Z5111 Encounter for antineoplastic chemotherapy: Secondary | ICD-10-CM | POA: Diagnosis not present

## 2021-10-06 DIAGNOSIS — C9202 Acute myeloblastic leukemia, in relapse: Secondary | ICD-10-CM | POA: Diagnosis not present

## 2021-10-06 DIAGNOSIS — Z5111 Encounter for antineoplastic chemotherapy: Secondary | ICD-10-CM | POA: Diagnosis not present

## 2021-10-08 DIAGNOSIS — Z5111 Encounter for antineoplastic chemotherapy: Secondary | ICD-10-CM | POA: Diagnosis not present

## 2021-10-08 DIAGNOSIS — C9202 Acute myeloblastic leukemia, in relapse: Secondary | ICD-10-CM | POA: Diagnosis not present

## 2021-10-09 DIAGNOSIS — Z5111 Encounter for antineoplastic chemotherapy: Secondary | ICD-10-CM | POA: Diagnosis not present

## 2021-10-09 DIAGNOSIS — C9202 Acute myeloblastic leukemia, in relapse: Secondary | ICD-10-CM | POA: Diagnosis not present

## 2021-10-12 DIAGNOSIS — R7989 Other specified abnormal findings of blood chemistry: Secondary | ICD-10-CM | POA: Diagnosis not present

## 2021-10-12 DIAGNOSIS — C9202 Acute myeloblastic leukemia, in relapse: Secondary | ICD-10-CM | POA: Diagnosis not present

## 2021-10-16 DIAGNOSIS — C9202 Acute myeloblastic leukemia, in relapse: Secondary | ICD-10-CM | POA: Diagnosis not present

## 2021-10-16 DIAGNOSIS — R7989 Other specified abnormal findings of blood chemistry: Secondary | ICD-10-CM | POA: Diagnosis not present

## 2021-10-19 DIAGNOSIS — C9202 Acute myeloblastic leukemia, in relapse: Secondary | ICD-10-CM | POA: Diagnosis not present

## 2021-10-19 DIAGNOSIS — C9201 Acute myeloblastic leukemia, in remission: Secondary | ICD-10-CM | POA: Diagnosis not present

## 2021-10-19 DIAGNOSIS — D61818 Other pancytopenia: Secondary | ICD-10-CM | POA: Diagnosis not present

## 2021-10-19 DIAGNOSIS — R7989 Other specified abnormal findings of blood chemistry: Secondary | ICD-10-CM | POA: Diagnosis not present

## 2021-10-23 DIAGNOSIS — F0631 Mood disorder due to known physiological condition with depressive features: Secondary | ICD-10-CM | POA: Diagnosis not present

## 2021-10-23 DIAGNOSIS — R110 Nausea: Secondary | ICD-10-CM | POA: Diagnosis not present

## 2021-10-23 DIAGNOSIS — Z95828 Presence of other vascular implants and grafts: Secondary | ICD-10-CM | POA: Diagnosis not present

## 2021-10-23 DIAGNOSIS — C9200 Acute myeloblastic leukemia, not having achieved remission: Secondary | ICD-10-CM | POA: Diagnosis not present

## 2021-10-23 DIAGNOSIS — K5900 Constipation, unspecified: Secondary | ICD-10-CM | POA: Diagnosis not present

## 2021-10-23 DIAGNOSIS — R142 Eructation: Secondary | ICD-10-CM | POA: Diagnosis not present

## 2021-10-23 DIAGNOSIS — D709 Neutropenia, unspecified: Secondary | ICD-10-CM | POA: Diagnosis not present

## 2021-10-23 DIAGNOSIS — D61818 Other pancytopenia: Secondary | ICD-10-CM | POA: Diagnosis not present

## 2021-10-23 DIAGNOSIS — C9201 Acute myeloblastic leukemia, in remission: Secondary | ICD-10-CM | POA: Diagnosis not present

## 2021-10-23 DIAGNOSIS — R232 Flushing: Secondary | ICD-10-CM | POA: Diagnosis not present

## 2021-10-23 DIAGNOSIS — R143 Flatulence: Secondary | ICD-10-CM | POA: Diagnosis not present

## 2021-10-26 DIAGNOSIS — C9200 Acute myeloblastic leukemia, not having achieved remission: Secondary | ICD-10-CM | POA: Diagnosis not present

## 2021-10-26 DIAGNOSIS — C9202 Acute myeloblastic leukemia, in relapse: Secondary | ICD-10-CM | POA: Diagnosis not present

## 2021-10-26 DIAGNOSIS — R7989 Other specified abnormal findings of blood chemistry: Secondary | ICD-10-CM | POA: Diagnosis not present

## 2021-10-30 DIAGNOSIS — Z5111 Encounter for antineoplastic chemotherapy: Secondary | ICD-10-CM | POA: Diagnosis not present

## 2021-10-30 DIAGNOSIS — Z862 Personal history of diseases of the blood and blood-forming organs and certain disorders involving the immune mechanism: Secondary | ICD-10-CM | POA: Diagnosis not present

## 2021-10-30 DIAGNOSIS — D61818 Other pancytopenia: Secondary | ICD-10-CM | POA: Diagnosis not present

## 2021-10-30 DIAGNOSIS — C9202 Acute myeloblastic leukemia, in relapse: Secondary | ICD-10-CM | POA: Diagnosis not present

## 2021-10-31 DIAGNOSIS — D61818 Other pancytopenia: Secondary | ICD-10-CM | POA: Diagnosis not present

## 2021-10-31 DIAGNOSIS — C9202 Acute myeloblastic leukemia, in relapse: Secondary | ICD-10-CM | POA: Diagnosis not present

## 2021-10-31 DIAGNOSIS — Z5111 Encounter for antineoplastic chemotherapy: Secondary | ICD-10-CM | POA: Diagnosis not present

## 2021-11-01 DIAGNOSIS — D61818 Other pancytopenia: Secondary | ICD-10-CM | POA: Diagnosis not present

## 2021-11-01 DIAGNOSIS — C9202 Acute myeloblastic leukemia, in relapse: Secondary | ICD-10-CM | POA: Diagnosis not present

## 2021-11-01 DIAGNOSIS — Z5111 Encounter for antineoplastic chemotherapy: Secondary | ICD-10-CM | POA: Diagnosis not present

## 2021-11-02 DIAGNOSIS — Z5111 Encounter for antineoplastic chemotherapy: Secondary | ICD-10-CM | POA: Diagnosis not present

## 2021-11-02 DIAGNOSIS — C9202 Acute myeloblastic leukemia, in relapse: Secondary | ICD-10-CM | POA: Diagnosis not present

## 2021-11-02 DIAGNOSIS — D61818 Other pancytopenia: Secondary | ICD-10-CM | POA: Diagnosis not present

## 2021-11-03 DIAGNOSIS — Z5111 Encounter for antineoplastic chemotherapy: Secondary | ICD-10-CM | POA: Diagnosis not present

## 2021-11-03 DIAGNOSIS — C9202 Acute myeloblastic leukemia, in relapse: Secondary | ICD-10-CM | POA: Diagnosis not present

## 2021-11-03 DIAGNOSIS — D61818 Other pancytopenia: Secondary | ICD-10-CM | POA: Diagnosis not present

## 2021-11-05 DIAGNOSIS — Z5111 Encounter for antineoplastic chemotherapy: Secondary | ICD-10-CM | POA: Diagnosis not present

## 2021-11-05 DIAGNOSIS — C9202 Acute myeloblastic leukemia, in relapse: Secondary | ICD-10-CM | POA: Diagnosis not present

## 2021-11-05 DIAGNOSIS — D61818 Other pancytopenia: Secondary | ICD-10-CM | POA: Diagnosis not present

## 2021-11-06 DIAGNOSIS — F32A Depression, unspecified: Secondary | ICD-10-CM | POA: Diagnosis not present

## 2021-11-06 DIAGNOSIS — D709 Neutropenia, unspecified: Secondary | ICD-10-CM | POA: Diagnosis not present

## 2021-11-06 DIAGNOSIS — R110 Nausea: Secondary | ICD-10-CM | POA: Diagnosis not present

## 2021-11-06 DIAGNOSIS — Z7969 Long term (current) use of other immunomodulators and immunosuppressants: Secondary | ICD-10-CM | POA: Diagnosis not present

## 2021-11-06 DIAGNOSIS — C9200 Acute myeloblastic leukemia, not having achieved remission: Secondary | ICD-10-CM | POA: Diagnosis not present

## 2021-11-06 DIAGNOSIS — R232 Flushing: Secondary | ICD-10-CM | POA: Diagnosis not present

## 2021-11-06 DIAGNOSIS — Z95828 Presence of other vascular implants and grafts: Secondary | ICD-10-CM | POA: Diagnosis not present

## 2021-11-06 DIAGNOSIS — Z5111 Encounter for antineoplastic chemotherapy: Secondary | ICD-10-CM | POA: Diagnosis not present

## 2021-11-06 DIAGNOSIS — K5900 Constipation, unspecified: Secondary | ICD-10-CM | POA: Diagnosis not present

## 2021-11-06 DIAGNOSIS — C9201 Acute myeloblastic leukemia, in remission: Secondary | ICD-10-CM | POA: Diagnosis not present

## 2021-11-06 DIAGNOSIS — I3139 Other pericardial effusion (noninflammatory): Secondary | ICD-10-CM | POA: Diagnosis not present

## 2021-11-09 DIAGNOSIS — C9202 Acute myeloblastic leukemia, in relapse: Secondary | ICD-10-CM | POA: Diagnosis not present

## 2021-11-09 DIAGNOSIS — Z79899 Other long term (current) drug therapy: Secondary | ICD-10-CM | POA: Diagnosis not present

## 2021-11-09 DIAGNOSIS — D61818 Other pancytopenia: Secondary | ICD-10-CM | POA: Diagnosis not present

## 2021-11-13 DIAGNOSIS — C9202 Acute myeloblastic leukemia, in relapse: Secondary | ICD-10-CM | POA: Diagnosis not present

## 2021-11-13 DIAGNOSIS — D61818 Other pancytopenia: Secondary | ICD-10-CM | POA: Diagnosis not present

## 2021-11-13 DIAGNOSIS — Z79899 Other long term (current) drug therapy: Secondary | ICD-10-CM | POA: Diagnosis not present

## 2021-11-16 DIAGNOSIS — Z79899 Other long term (current) drug therapy: Secondary | ICD-10-CM | POA: Diagnosis not present

## 2021-11-16 DIAGNOSIS — D61818 Other pancytopenia: Secondary | ICD-10-CM | POA: Diagnosis not present

## 2021-11-16 DIAGNOSIS — C9202 Acute myeloblastic leukemia, in relapse: Secondary | ICD-10-CM | POA: Diagnosis not present

## 2021-11-20 DIAGNOSIS — C9202 Acute myeloblastic leukemia, in relapse: Secondary | ICD-10-CM | POA: Diagnosis not present

## 2021-11-20 DIAGNOSIS — Z79899 Other long term (current) drug therapy: Secondary | ICD-10-CM | POA: Diagnosis not present

## 2021-11-23 DIAGNOSIS — C9202 Acute myeloblastic leukemia, in relapse: Secondary | ICD-10-CM | POA: Diagnosis not present

## 2021-11-23 DIAGNOSIS — Z79899 Other long term (current) drug therapy: Secondary | ICD-10-CM | POA: Diagnosis not present

## 2021-11-27 DIAGNOSIS — D61818 Other pancytopenia: Secondary | ICD-10-CM | POA: Diagnosis not present

## 2021-11-27 DIAGNOSIS — Z79899 Other long term (current) drug therapy: Secondary | ICD-10-CM | POA: Diagnosis not present

## 2021-11-27 DIAGNOSIS — I959 Hypotension, unspecified: Secondary | ICD-10-CM | POA: Diagnosis not present

## 2021-11-27 DIAGNOSIS — D709 Neutropenia, unspecified: Secondary | ICD-10-CM | POA: Diagnosis not present

## 2021-11-27 DIAGNOSIS — E538 Deficiency of other specified B group vitamins: Secondary | ICD-10-CM | POA: Diagnosis not present

## 2021-11-27 DIAGNOSIS — K5900 Constipation, unspecified: Secondary | ICD-10-CM | POA: Diagnosis not present

## 2021-11-27 DIAGNOSIS — L509 Urticaria, unspecified: Secondary | ICD-10-CM | POA: Diagnosis not present

## 2021-11-27 DIAGNOSIS — C9200 Acute myeloblastic leukemia, not having achieved remission: Secondary | ICD-10-CM | POA: Diagnosis not present

## 2021-11-27 DIAGNOSIS — Z85828 Personal history of other malignant neoplasm of skin: Secondary | ICD-10-CM | POA: Diagnosis not present

## 2021-11-27 DIAGNOSIS — R232 Flushing: Secondary | ICD-10-CM | POA: Diagnosis not present

## 2021-11-27 DIAGNOSIS — Z7969 Long term (current) use of other immunomodulators and immunosuppressants: Secondary | ICD-10-CM | POA: Diagnosis not present

## 2021-11-27 DIAGNOSIS — Z95828 Presence of other vascular implants and grafts: Secondary | ICD-10-CM | POA: Diagnosis not present

## 2021-11-27 DIAGNOSIS — R42 Dizziness and giddiness: Secondary | ICD-10-CM | POA: Diagnosis not present

## 2021-11-27 DIAGNOSIS — Z5111 Encounter for antineoplastic chemotherapy: Secondary | ICD-10-CM | POA: Diagnosis not present

## 2021-11-27 DIAGNOSIS — F32A Depression, unspecified: Secondary | ICD-10-CM | POA: Diagnosis not present

## 2021-11-27 DIAGNOSIS — R110 Nausea: Secondary | ICD-10-CM | POA: Diagnosis not present

## 2021-11-27 DIAGNOSIS — I3139 Other pericardial effusion (noninflammatory): Secondary | ICD-10-CM | POA: Diagnosis not present

## 2021-11-27 DIAGNOSIS — C9201 Acute myeloblastic leukemia, in remission: Secondary | ICD-10-CM | POA: Diagnosis not present

## 2021-11-28 DIAGNOSIS — C9201 Acute myeloblastic leukemia, in remission: Secondary | ICD-10-CM | POA: Diagnosis not present

## 2021-11-28 DIAGNOSIS — Z5111 Encounter for antineoplastic chemotherapy: Secondary | ICD-10-CM | POA: Diagnosis not present

## 2021-11-29 DIAGNOSIS — Z5111 Encounter for antineoplastic chemotherapy: Secondary | ICD-10-CM | POA: Diagnosis not present

## 2021-11-29 DIAGNOSIS — C9201 Acute myeloblastic leukemia, in remission: Secondary | ICD-10-CM | POA: Diagnosis not present

## 2021-11-30 DIAGNOSIS — Z5111 Encounter for antineoplastic chemotherapy: Secondary | ICD-10-CM | POA: Diagnosis not present

## 2021-11-30 DIAGNOSIS — C9201 Acute myeloblastic leukemia, in remission: Secondary | ICD-10-CM | POA: Diagnosis not present

## 2021-12-01 DIAGNOSIS — C9201 Acute myeloblastic leukemia, in remission: Secondary | ICD-10-CM | POA: Diagnosis not present

## 2021-12-01 DIAGNOSIS — Z5111 Encounter for antineoplastic chemotherapy: Secondary | ICD-10-CM | POA: Diagnosis not present

## 2021-12-03 DIAGNOSIS — Z5111 Encounter for antineoplastic chemotherapy: Secondary | ICD-10-CM | POA: Diagnosis not present

## 2021-12-03 DIAGNOSIS — C9201 Acute myeloblastic leukemia, in remission: Secondary | ICD-10-CM | POA: Diagnosis not present

## 2021-12-03 DIAGNOSIS — D61818 Other pancytopenia: Secondary | ICD-10-CM | POA: Diagnosis not present

## 2021-12-04 DIAGNOSIS — C9201 Acute myeloblastic leukemia, in remission: Secondary | ICD-10-CM | POA: Diagnosis not present

## 2021-12-04 DIAGNOSIS — Z5111 Encounter for antineoplastic chemotherapy: Secondary | ICD-10-CM | POA: Diagnosis not present

## 2021-12-07 DIAGNOSIS — Z79899 Other long term (current) drug therapy: Secondary | ICD-10-CM | POA: Diagnosis not present

## 2021-12-07 DIAGNOSIS — D61818 Other pancytopenia: Secondary | ICD-10-CM | POA: Diagnosis not present

## 2021-12-07 DIAGNOSIS — C9201 Acute myeloblastic leukemia, in remission: Secondary | ICD-10-CM | POA: Diagnosis not present

## 2021-12-11 DIAGNOSIS — D61818 Other pancytopenia: Secondary | ICD-10-CM | POA: Diagnosis not present

## 2021-12-11 DIAGNOSIS — C9201 Acute myeloblastic leukemia, in remission: Secondary | ICD-10-CM | POA: Diagnosis not present

## 2021-12-11 DIAGNOSIS — Z79899 Other long term (current) drug therapy: Secondary | ICD-10-CM | POA: Diagnosis not present

## 2021-12-14 DIAGNOSIS — D61818 Other pancytopenia: Secondary | ICD-10-CM | POA: Diagnosis not present

## 2021-12-14 DIAGNOSIS — C9201 Acute myeloblastic leukemia, in remission: Secondary | ICD-10-CM | POA: Diagnosis not present

## 2021-12-14 DIAGNOSIS — Z79899 Other long term (current) drug therapy: Secondary | ICD-10-CM | POA: Diagnosis not present

## 2021-12-18 DIAGNOSIS — R232 Flushing: Secondary | ICD-10-CM | POA: Diagnosis not present

## 2021-12-18 DIAGNOSIS — D709 Neutropenia, unspecified: Secondary | ICD-10-CM | POA: Diagnosis not present

## 2021-12-18 DIAGNOSIS — Z85828 Personal history of other malignant neoplasm of skin: Secondary | ICD-10-CM | POA: Diagnosis not present

## 2021-12-18 DIAGNOSIS — Z7969 Long term (current) use of other immunomodulators and immunosuppressants: Secondary | ICD-10-CM | POA: Diagnosis not present

## 2021-12-18 DIAGNOSIS — L509 Urticaria, unspecified: Secondary | ICD-10-CM | POA: Diagnosis not present

## 2021-12-18 DIAGNOSIS — D61818 Other pancytopenia: Secondary | ICD-10-CM | POA: Diagnosis not present

## 2021-12-18 DIAGNOSIS — R140 Abdominal distension (gaseous): Secondary | ICD-10-CM | POA: Diagnosis not present

## 2021-12-18 DIAGNOSIS — C9200 Acute myeloblastic leukemia, not having achieved remission: Secondary | ICD-10-CM | POA: Diagnosis not present

## 2021-12-18 DIAGNOSIS — R42 Dizziness and giddiness: Secondary | ICD-10-CM | POA: Diagnosis not present

## 2021-12-18 DIAGNOSIS — R110 Nausea: Secondary | ICD-10-CM | POA: Diagnosis not present

## 2021-12-18 DIAGNOSIS — C9201 Acute myeloblastic leukemia, in remission: Secondary | ICD-10-CM | POA: Diagnosis not present

## 2021-12-18 DIAGNOSIS — K5900 Constipation, unspecified: Secondary | ICD-10-CM | POA: Diagnosis not present

## 2021-12-18 DIAGNOSIS — I3139 Other pericardial effusion (noninflammatory): Secondary | ICD-10-CM | POA: Diagnosis not present

## 2021-12-18 DIAGNOSIS — F32A Depression, unspecified: Secondary | ICD-10-CM | POA: Diagnosis not present

## 2021-12-18 DIAGNOSIS — E538 Deficiency of other specified B group vitamins: Secondary | ICD-10-CM | POA: Diagnosis not present

## 2021-12-18 DIAGNOSIS — I959 Hypotension, unspecified: Secondary | ICD-10-CM | POA: Diagnosis not present

## 2021-12-20 DIAGNOSIS — C9200 Acute myeloblastic leukemia, not having achieved remission: Secondary | ICD-10-CM | POA: Diagnosis not present

## 2021-12-21 DIAGNOSIS — C9200 Acute myeloblastic leukemia, not having achieved remission: Secondary | ICD-10-CM | POA: Diagnosis not present

## 2021-12-21 DIAGNOSIS — R7989 Other specified abnormal findings of blood chemistry: Secondary | ICD-10-CM | POA: Diagnosis not present

## 2021-12-25 DIAGNOSIS — R143 Flatulence: Secondary | ICD-10-CM | POA: Diagnosis not present

## 2021-12-25 DIAGNOSIS — Z79818 Long term (current) use of other agents affecting estrogen receptors and estrogen levels: Secondary | ICD-10-CM | POA: Diagnosis not present

## 2021-12-25 DIAGNOSIS — F32A Depression, unspecified: Secondary | ICD-10-CM | POA: Diagnosis not present

## 2021-12-25 DIAGNOSIS — C9201 Acute myeloblastic leukemia, in remission: Secondary | ICD-10-CM | POA: Diagnosis not present

## 2021-12-25 DIAGNOSIS — R110 Nausea: Secondary | ICD-10-CM | POA: Diagnosis not present

## 2021-12-25 DIAGNOSIS — Z95828 Presence of other vascular implants and grafts: Secondary | ICD-10-CM | POA: Diagnosis not present

## 2021-12-25 DIAGNOSIS — Z5181 Encounter for therapeutic drug level monitoring: Secondary | ICD-10-CM | POA: Diagnosis not present

## 2021-12-25 DIAGNOSIS — R232 Flushing: Secondary | ICD-10-CM | POA: Diagnosis not present

## 2021-12-25 DIAGNOSIS — K5900 Constipation, unspecified: Secondary | ICD-10-CM | POA: Diagnosis not present

## 2021-12-25 DIAGNOSIS — Z85828 Personal history of other malignant neoplasm of skin: Secondary | ICD-10-CM | POA: Diagnosis not present

## 2021-12-25 DIAGNOSIS — Z872 Personal history of diseases of the skin and subcutaneous tissue: Secondary | ICD-10-CM | POA: Diagnosis not present

## 2021-12-28 DIAGNOSIS — C9200 Acute myeloblastic leukemia, not having achieved remission: Secondary | ICD-10-CM | POA: Diagnosis not present

## 2021-12-28 DIAGNOSIS — R7989 Other specified abnormal findings of blood chemistry: Secondary | ICD-10-CM | POA: Diagnosis not present

## 2022-01-02 DIAGNOSIS — R7989 Other specified abnormal findings of blood chemistry: Secondary | ICD-10-CM | POA: Diagnosis not present

## 2022-01-02 DIAGNOSIS — C9200 Acute myeloblastic leukemia, not having achieved remission: Secondary | ICD-10-CM | POA: Diagnosis not present

## 2022-01-04 DIAGNOSIS — R7989 Other specified abnormal findings of blood chemistry: Secondary | ICD-10-CM | POA: Diagnosis not present

## 2022-01-04 DIAGNOSIS — C9200 Acute myeloblastic leukemia, not having achieved remission: Secondary | ICD-10-CM | POA: Diagnosis not present

## 2022-01-04 DIAGNOSIS — D61818 Other pancytopenia: Secondary | ICD-10-CM | POA: Diagnosis not present

## 2022-01-08 DIAGNOSIS — C9200 Acute myeloblastic leukemia, not having achieved remission: Secondary | ICD-10-CM | POA: Diagnosis not present

## 2022-01-08 DIAGNOSIS — D61818 Other pancytopenia: Secondary | ICD-10-CM | POA: Diagnosis not present

## 2022-01-08 DIAGNOSIS — R7989 Other specified abnormal findings of blood chemistry: Secondary | ICD-10-CM | POA: Diagnosis not present

## 2022-01-11 DIAGNOSIS — D709 Neutropenia, unspecified: Secondary | ICD-10-CM | POA: Diagnosis not present

## 2022-01-11 DIAGNOSIS — R142 Eructation: Secondary | ICD-10-CM | POA: Diagnosis not present

## 2022-01-11 DIAGNOSIS — Z79899 Other long term (current) drug therapy: Secondary | ICD-10-CM | POA: Diagnosis not present

## 2022-01-11 DIAGNOSIS — Z7969 Long term (current) use of other immunomodulators and immunosuppressants: Secondary | ICD-10-CM | POA: Diagnosis not present

## 2022-01-11 DIAGNOSIS — Z85828 Personal history of other malignant neoplasm of skin: Secondary | ICD-10-CM | POA: Diagnosis not present

## 2022-01-11 DIAGNOSIS — Z959 Presence of cardiac and vascular implant and graft, unspecified: Secondary | ICD-10-CM | POA: Diagnosis not present

## 2022-01-11 DIAGNOSIS — M5432 Sciatica, left side: Secondary | ICD-10-CM | POA: Diagnosis not present

## 2022-01-11 DIAGNOSIS — I3139 Other pericardial effusion (noninflammatory): Secondary | ICD-10-CM | POA: Diagnosis not present

## 2022-01-11 DIAGNOSIS — D61818 Other pancytopenia: Secondary | ICD-10-CM | POA: Diagnosis not present

## 2022-01-11 DIAGNOSIS — Z9889 Other specified postprocedural states: Secondary | ICD-10-CM | POA: Diagnosis not present

## 2022-01-11 DIAGNOSIS — R232 Flushing: Secondary | ICD-10-CM | POA: Diagnosis not present

## 2022-01-11 DIAGNOSIS — F0631 Mood disorder due to known physiological condition with depressive features: Secondary | ICD-10-CM | POA: Diagnosis not present

## 2022-01-11 DIAGNOSIS — R110 Nausea: Secondary | ICD-10-CM | POA: Diagnosis not present

## 2022-01-11 DIAGNOSIS — C9200 Acute myeloblastic leukemia, not having achieved remission: Secondary | ICD-10-CM | POA: Diagnosis not present

## 2022-01-11 DIAGNOSIS — Z95828 Presence of other vascular implants and grafts: Secondary | ICD-10-CM | POA: Diagnosis not present

## 2022-01-11 DIAGNOSIS — K5900 Constipation, unspecified: Secondary | ICD-10-CM | POA: Diagnosis not present

## 2022-01-11 DIAGNOSIS — C9201 Acute myeloblastic leukemia, in remission: Secondary | ICD-10-CM | POA: Diagnosis not present

## 2022-01-15 DIAGNOSIS — C9201 Acute myeloblastic leukemia, in remission: Secondary | ICD-10-CM | POA: Diagnosis not present

## 2022-01-15 DIAGNOSIS — R7989 Other specified abnormal findings of blood chemistry: Secondary | ICD-10-CM | POA: Diagnosis not present

## 2022-01-15 DIAGNOSIS — D61818 Other pancytopenia: Secondary | ICD-10-CM | POA: Diagnosis not present

## 2022-01-18 DIAGNOSIS — R7989 Other specified abnormal findings of blood chemistry: Secondary | ICD-10-CM | POA: Diagnosis not present

## 2022-01-18 DIAGNOSIS — C9201 Acute myeloblastic leukemia, in remission: Secondary | ICD-10-CM | POA: Diagnosis not present

## 2022-01-18 DIAGNOSIS — D61818 Other pancytopenia: Secondary | ICD-10-CM | POA: Diagnosis not present

## 2022-01-22 DIAGNOSIS — M5432 Sciatica, left side: Secondary | ICD-10-CM | POA: Diagnosis not present

## 2022-01-22 DIAGNOSIS — D61818 Other pancytopenia: Secondary | ICD-10-CM | POA: Diagnosis not present

## 2022-01-22 DIAGNOSIS — F32A Depression, unspecified: Secondary | ICD-10-CM | POA: Diagnosis not present

## 2022-01-22 DIAGNOSIS — I3139 Other pericardial effusion (noninflammatory): Secondary | ICD-10-CM | POA: Diagnosis not present

## 2022-01-22 DIAGNOSIS — D709 Neutropenia, unspecified: Secondary | ICD-10-CM | POA: Diagnosis not present

## 2022-01-22 DIAGNOSIS — Z95828 Presence of other vascular implants and grafts: Secondary | ICD-10-CM | POA: Diagnosis not present

## 2022-01-22 DIAGNOSIS — M79605 Pain in left leg: Secondary | ICD-10-CM | POA: Diagnosis not present

## 2022-01-22 DIAGNOSIS — C9201 Acute myeloblastic leukemia, in remission: Secondary | ICD-10-CM | POA: Diagnosis not present

## 2022-01-22 DIAGNOSIS — L989 Disorder of the skin and subcutaneous tissue, unspecified: Secondary | ICD-10-CM | POA: Diagnosis not present

## 2022-01-22 DIAGNOSIS — K5900 Constipation, unspecified: Secondary | ICD-10-CM | POA: Diagnosis not present

## 2022-01-22 DIAGNOSIS — C9202 Acute myeloblastic leukemia, in relapse: Secondary | ICD-10-CM | POA: Diagnosis not present

## 2022-01-22 DIAGNOSIS — R232 Flushing: Secondary | ICD-10-CM | POA: Diagnosis not present

## 2022-01-22 DIAGNOSIS — R110 Nausea: Secondary | ICD-10-CM | POA: Diagnosis not present

## 2022-01-25 DIAGNOSIS — C9201 Acute myeloblastic leukemia, in remission: Secondary | ICD-10-CM | POA: Diagnosis not present

## 2022-01-25 DIAGNOSIS — D61818 Other pancytopenia: Secondary | ICD-10-CM | POA: Diagnosis not present

## 2022-01-25 DIAGNOSIS — R7989 Other specified abnormal findings of blood chemistry: Secondary | ICD-10-CM | POA: Diagnosis not present

## 2022-01-25 DIAGNOSIS — D709 Neutropenia, unspecified: Secondary | ICD-10-CM | POA: Diagnosis not present

## 2022-01-29 DIAGNOSIS — R7989 Other specified abnormal findings of blood chemistry: Secondary | ICD-10-CM | POA: Diagnosis not present

## 2022-01-29 DIAGNOSIS — C9200 Acute myeloblastic leukemia, not having achieved remission: Secondary | ICD-10-CM | POA: Diagnosis not present

## 2022-02-01 DIAGNOSIS — R7989 Other specified abnormal findings of blood chemistry: Secondary | ICD-10-CM | POA: Diagnosis not present

## 2022-02-01 DIAGNOSIS — C9201 Acute myeloblastic leukemia, in remission: Secondary | ICD-10-CM | POA: Diagnosis not present

## 2022-02-01 DIAGNOSIS — D61818 Other pancytopenia: Secondary | ICD-10-CM | POA: Diagnosis not present

## 2022-02-01 DIAGNOSIS — Z95828 Presence of other vascular implants and grafts: Secondary | ICD-10-CM | POA: Diagnosis not present

## 2022-02-04 DIAGNOSIS — D61818 Other pancytopenia: Secondary | ICD-10-CM | POA: Diagnosis not present

## 2022-02-04 DIAGNOSIS — Z95828 Presence of other vascular implants and grafts: Secondary | ICD-10-CM | POA: Diagnosis not present

## 2022-02-04 DIAGNOSIS — C9201 Acute myeloblastic leukemia, in remission: Secondary | ICD-10-CM | POA: Diagnosis not present

## 2022-02-04 DIAGNOSIS — R7989 Other specified abnormal findings of blood chemistry: Secondary | ICD-10-CM | POA: Diagnosis not present

## 2022-02-08 DIAGNOSIS — M549 Dorsalgia, unspecified: Secondary | ICD-10-CM | POA: Diagnosis not present

## 2022-02-08 DIAGNOSIS — D709 Neutropenia, unspecified: Secondary | ICD-10-CM | POA: Diagnosis not present

## 2022-02-08 DIAGNOSIS — L989 Disorder of the skin and subcutaneous tissue, unspecified: Secondary | ICD-10-CM | POA: Diagnosis not present

## 2022-02-08 DIAGNOSIS — R232 Flushing: Secondary | ICD-10-CM | POA: Diagnosis not present

## 2022-02-08 DIAGNOSIS — Z95828 Presence of other vascular implants and grafts: Secondary | ICD-10-CM | POA: Diagnosis not present

## 2022-02-08 DIAGNOSIS — Z85828 Personal history of other malignant neoplasm of skin: Secondary | ICD-10-CM | POA: Diagnosis not present

## 2022-02-08 DIAGNOSIS — C9201 Acute myeloblastic leukemia, in remission: Secondary | ICD-10-CM | POA: Diagnosis not present

## 2022-02-08 DIAGNOSIS — D61818 Other pancytopenia: Secondary | ICD-10-CM | POA: Diagnosis not present

## 2022-02-08 DIAGNOSIS — R110 Nausea: Secondary | ICD-10-CM | POA: Diagnosis not present

## 2022-02-08 DIAGNOSIS — C9200 Acute myeloblastic leukemia, not having achieved remission: Secondary | ICD-10-CM | POA: Diagnosis not present

## 2022-02-08 DIAGNOSIS — M5431 Sciatica, right side: Secondary | ICD-10-CM | POA: Diagnosis not present

## 2022-02-08 DIAGNOSIS — M79604 Pain in right leg: Secondary | ICD-10-CM | POA: Diagnosis not present

## 2022-02-08 DIAGNOSIS — K5900 Constipation, unspecified: Secondary | ICD-10-CM | POA: Diagnosis not present

## 2022-02-08 DIAGNOSIS — F32A Depression, unspecified: Secondary | ICD-10-CM | POA: Diagnosis not present

## 2022-02-08 DIAGNOSIS — I3139 Other pericardial effusion (noninflammatory): Secondary | ICD-10-CM | POA: Diagnosis not present

## 2022-02-12 DIAGNOSIS — D61818 Other pancytopenia: Secondary | ICD-10-CM | POA: Diagnosis not present

## 2022-02-12 DIAGNOSIS — R7989 Other specified abnormal findings of blood chemistry: Secondary | ICD-10-CM | POA: Diagnosis not present

## 2022-02-12 DIAGNOSIS — C9201 Acute myeloblastic leukemia, in remission: Secondary | ICD-10-CM | POA: Diagnosis not present

## 2022-02-12 DIAGNOSIS — D709 Neutropenia, unspecified: Secondary | ICD-10-CM | POA: Diagnosis not present

## 2022-02-15 DIAGNOSIS — D61818 Other pancytopenia: Secondary | ICD-10-CM | POA: Diagnosis not present

## 2022-02-15 DIAGNOSIS — C9202 Acute myeloblastic leukemia, in relapse: Secondary | ICD-10-CM | POA: Diagnosis not present

## 2022-02-15 DIAGNOSIS — D709 Neutropenia, unspecified: Secondary | ICD-10-CM | POA: Diagnosis not present

## 2022-02-18 DIAGNOSIS — L814 Other melanin hyperpigmentation: Secondary | ICD-10-CM | POA: Diagnosis not present

## 2022-02-18 DIAGNOSIS — Z85828 Personal history of other malignant neoplasm of skin: Secondary | ICD-10-CM | POA: Diagnosis not present

## 2022-02-18 DIAGNOSIS — D485 Neoplasm of uncertain behavior of skin: Secondary | ICD-10-CM | POA: Diagnosis not present

## 2022-02-18 DIAGNOSIS — L570 Actinic keratosis: Secondary | ICD-10-CM | POA: Diagnosis not present

## 2022-02-19 DIAGNOSIS — I3139 Other pericardial effusion (noninflammatory): Secondary | ICD-10-CM | POA: Diagnosis not present

## 2022-02-19 DIAGNOSIS — N951 Menopausal and female climacteric states: Secondary | ICD-10-CM | POA: Diagnosis not present

## 2022-02-19 DIAGNOSIS — C9200 Acute myeloblastic leukemia, not having achieved remission: Secondary | ICD-10-CM | POA: Diagnosis not present

## 2022-02-19 DIAGNOSIS — Z85828 Personal history of other malignant neoplasm of skin: Secondary | ICD-10-CM | POA: Diagnosis not present

## 2022-02-19 DIAGNOSIS — R110 Nausea: Secondary | ICD-10-CM | POA: Diagnosis not present

## 2022-02-19 DIAGNOSIS — L299 Pruritus, unspecified: Secondary | ICD-10-CM | POA: Diagnosis not present

## 2022-02-19 DIAGNOSIS — D61818 Other pancytopenia: Secondary | ICD-10-CM | POA: Diagnosis not present

## 2022-02-19 DIAGNOSIS — Z95828 Presence of other vascular implants and grafts: Secondary | ICD-10-CM | POA: Diagnosis not present

## 2022-02-19 DIAGNOSIS — F32A Depression, unspecified: Secondary | ICD-10-CM | POA: Diagnosis not present

## 2022-02-19 DIAGNOSIS — M5431 Sciatica, right side: Secondary | ICD-10-CM | POA: Diagnosis not present

## 2022-02-19 DIAGNOSIS — R232 Flushing: Secondary | ICD-10-CM | POA: Diagnosis not present

## 2022-02-19 DIAGNOSIS — L989 Disorder of the skin and subcutaneous tissue, unspecified: Secondary | ICD-10-CM | POA: Diagnosis not present

## 2022-02-19 DIAGNOSIS — L539 Erythematous condition, unspecified: Secondary | ICD-10-CM | POA: Diagnosis not present

## 2022-02-19 DIAGNOSIS — C9201 Acute myeloblastic leukemia, in remission: Secondary | ICD-10-CM | POA: Diagnosis not present

## 2022-02-19 DIAGNOSIS — M549 Dorsalgia, unspecified: Secondary | ICD-10-CM | POA: Diagnosis not present

## 2022-02-19 DIAGNOSIS — K5900 Constipation, unspecified: Secondary | ICD-10-CM | POA: Diagnosis not present

## 2022-02-19 DIAGNOSIS — R112 Nausea with vomiting, unspecified: Secondary | ICD-10-CM | POA: Diagnosis not present

## 2022-02-20 DIAGNOSIS — C9202 Acute myeloblastic leukemia, in relapse: Secondary | ICD-10-CM | POA: Diagnosis not present

## 2022-02-22 DIAGNOSIS — D61818 Other pancytopenia: Secondary | ICD-10-CM | POA: Diagnosis not present

## 2022-02-22 DIAGNOSIS — R7989 Other specified abnormal findings of blood chemistry: Secondary | ICD-10-CM | POA: Diagnosis not present

## 2022-02-22 DIAGNOSIS — C9201 Acute myeloblastic leukemia, in remission: Secondary | ICD-10-CM | POA: Diagnosis not present

## 2022-02-25 DIAGNOSIS — D61818 Other pancytopenia: Secondary | ICD-10-CM | POA: Diagnosis not present

## 2022-02-25 DIAGNOSIS — C9201 Acute myeloblastic leukemia, in remission: Secondary | ICD-10-CM | POA: Diagnosis not present

## 2022-02-25 DIAGNOSIS — R7989 Other specified abnormal findings of blood chemistry: Secondary | ICD-10-CM | POA: Diagnosis not present

## 2022-03-01 DIAGNOSIS — Z8616 Personal history of COVID-19: Secondary | ICD-10-CM | POA: Diagnosis not present

## 2022-03-01 DIAGNOSIS — R232 Flushing: Secondary | ICD-10-CM | POA: Diagnosis not present

## 2022-03-01 DIAGNOSIS — R0981 Nasal congestion: Secondary | ICD-10-CM | POA: Diagnosis not present

## 2022-03-01 DIAGNOSIS — C9200 Acute myeloblastic leukemia, not having achieved remission: Secondary | ICD-10-CM | POA: Diagnosis not present

## 2022-03-01 DIAGNOSIS — I3139 Other pericardial effusion (noninflammatory): Secondary | ICD-10-CM | POA: Diagnosis not present

## 2022-03-01 DIAGNOSIS — M549 Dorsalgia, unspecified: Secondary | ICD-10-CM | POA: Diagnosis not present

## 2022-03-01 DIAGNOSIS — E538 Deficiency of other specified B group vitamins: Secondary | ICD-10-CM | POA: Diagnosis not present

## 2022-03-01 DIAGNOSIS — R519 Headache, unspecified: Secondary | ICD-10-CM | POA: Diagnosis not present

## 2022-03-01 DIAGNOSIS — R197 Diarrhea, unspecified: Secondary | ICD-10-CM | POA: Diagnosis not present

## 2022-03-01 DIAGNOSIS — M5430 Sciatica, unspecified side: Secondary | ICD-10-CM | POA: Diagnosis not present

## 2022-03-01 DIAGNOSIS — D61818 Other pancytopenia: Secondary | ICD-10-CM | POA: Diagnosis not present

## 2022-03-01 DIAGNOSIS — F32A Depression, unspecified: Secondary | ICD-10-CM | POA: Diagnosis not present

## 2022-03-01 DIAGNOSIS — K5900 Constipation, unspecified: Secondary | ICD-10-CM | POA: Diagnosis not present

## 2022-03-01 DIAGNOSIS — R21 Rash and other nonspecific skin eruption: Secondary | ICD-10-CM | POA: Diagnosis not present

## 2022-03-01 DIAGNOSIS — R110 Nausea: Secondary | ICD-10-CM | POA: Diagnosis not present

## 2022-03-01 DIAGNOSIS — R059 Cough, unspecified: Secondary | ICD-10-CM | POA: Diagnosis not present

## 2022-03-01 DIAGNOSIS — C9201 Acute myeloblastic leukemia, in remission: Secondary | ICD-10-CM | POA: Diagnosis not present

## 2022-03-01 DIAGNOSIS — M5431 Sciatica, right side: Secondary | ICD-10-CM | POA: Diagnosis not present

## 2022-03-05 DIAGNOSIS — D61818 Other pancytopenia: Secondary | ICD-10-CM | POA: Diagnosis not present

## 2022-03-05 DIAGNOSIS — Z79899 Other long term (current) drug therapy: Secondary | ICD-10-CM | POA: Diagnosis not present

## 2022-03-05 DIAGNOSIS — C9201 Acute myeloblastic leukemia, in remission: Secondary | ICD-10-CM | POA: Diagnosis not present

## 2022-03-08 DIAGNOSIS — C9202 Acute myeloblastic leukemia, in relapse: Secondary | ICD-10-CM | POA: Diagnosis not present

## 2022-03-08 DIAGNOSIS — M549 Dorsalgia, unspecified: Secondary | ICD-10-CM | POA: Diagnosis not present

## 2022-03-08 DIAGNOSIS — K5900 Constipation, unspecified: Secondary | ICD-10-CM | POA: Diagnosis not present

## 2022-03-08 DIAGNOSIS — F32A Depression, unspecified: Secondary | ICD-10-CM | POA: Diagnosis not present

## 2022-03-08 DIAGNOSIS — M5430 Sciatica, unspecified side: Secondary | ICD-10-CM | POA: Diagnosis not present

## 2022-03-08 DIAGNOSIS — R233 Spontaneous ecchymoses: Secondary | ICD-10-CM | POA: Diagnosis not present

## 2022-03-08 DIAGNOSIS — H538 Other visual disturbances: Secondary | ICD-10-CM | POA: Diagnosis not present

## 2022-03-08 DIAGNOSIS — Z8616 Personal history of COVID-19: Secondary | ICD-10-CM | POA: Diagnosis not present

## 2022-03-08 DIAGNOSIS — R110 Nausea: Secondary | ICD-10-CM | POA: Diagnosis not present

## 2022-03-08 DIAGNOSIS — D61818 Other pancytopenia: Secondary | ICD-10-CM | POA: Diagnosis not present

## 2022-03-08 DIAGNOSIS — I3139 Other pericardial effusion (noninflammatory): Secondary | ICD-10-CM | POA: Diagnosis not present

## 2022-03-08 DIAGNOSIS — D709 Neutropenia, unspecified: Secondary | ICD-10-CM | POA: Diagnosis not present

## 2022-03-08 DIAGNOSIS — R0981 Nasal congestion: Secondary | ICD-10-CM | POA: Diagnosis not present

## 2022-03-08 DIAGNOSIS — Z85828 Personal history of other malignant neoplasm of skin: Secondary | ICD-10-CM | POA: Diagnosis not present

## 2022-03-08 DIAGNOSIS — C9201 Acute myeloblastic leukemia, in remission: Secondary | ICD-10-CM | POA: Diagnosis not present

## 2022-03-08 DIAGNOSIS — R232 Flushing: Secondary | ICD-10-CM | POA: Diagnosis not present

## 2022-03-12 DIAGNOSIS — C9201 Acute myeloblastic leukemia, in remission: Secondary | ICD-10-CM | POA: Diagnosis not present

## 2022-03-12 DIAGNOSIS — D709 Neutropenia, unspecified: Secondary | ICD-10-CM | POA: Diagnosis not present

## 2022-03-12 DIAGNOSIS — R791 Abnormal coagulation profile: Secondary | ICD-10-CM | POA: Diagnosis not present

## 2022-03-12 DIAGNOSIS — R7989 Other specified abnormal findings of blood chemistry: Secondary | ICD-10-CM | POA: Diagnosis not present

## 2022-03-12 DIAGNOSIS — D61818 Other pancytopenia: Secondary | ICD-10-CM | POA: Diagnosis not present

## 2022-03-13 DIAGNOSIS — Z5111 Encounter for antineoplastic chemotherapy: Secondary | ICD-10-CM | POA: Diagnosis not present

## 2022-03-13 DIAGNOSIS — C9201 Acute myeloblastic leukemia, in remission: Secondary | ICD-10-CM | POA: Diagnosis not present

## 2022-03-14 DIAGNOSIS — D61818 Other pancytopenia: Secondary | ICD-10-CM | POA: Diagnosis not present

## 2022-03-14 DIAGNOSIS — C9201 Acute myeloblastic leukemia, in remission: Secondary | ICD-10-CM | POA: Diagnosis not present

## 2022-03-14 DIAGNOSIS — D709 Neutropenia, unspecified: Secondary | ICD-10-CM | POA: Diagnosis not present

## 2022-03-14 DIAGNOSIS — Z5111 Encounter for antineoplastic chemotherapy: Secondary | ICD-10-CM | POA: Diagnosis not present

## 2022-03-15 DIAGNOSIS — Z5111 Encounter for antineoplastic chemotherapy: Secondary | ICD-10-CM | POA: Diagnosis not present

## 2022-03-15 DIAGNOSIS — D61818 Other pancytopenia: Secondary | ICD-10-CM | POA: Diagnosis not present

## 2022-03-15 DIAGNOSIS — C9201 Acute myeloblastic leukemia, in remission: Secondary | ICD-10-CM | POA: Diagnosis not present

## 2022-03-16 DIAGNOSIS — C9201 Acute myeloblastic leukemia, in remission: Secondary | ICD-10-CM | POA: Diagnosis not present

## 2022-03-16 DIAGNOSIS — Z5111 Encounter for antineoplastic chemotherapy: Secondary | ICD-10-CM | POA: Diagnosis not present

## 2022-03-16 DIAGNOSIS — D61818 Other pancytopenia: Secondary | ICD-10-CM | POA: Diagnosis not present

## 2022-03-19 DIAGNOSIS — C9201 Acute myeloblastic leukemia, in remission: Secondary | ICD-10-CM | POA: Diagnosis not present

## 2022-03-19 DIAGNOSIS — D61818 Other pancytopenia: Secondary | ICD-10-CM | POA: Diagnosis not present

## 2022-03-19 DIAGNOSIS — R110 Nausea: Secondary | ICD-10-CM | POA: Diagnosis not present

## 2022-03-22 DIAGNOSIS — D61818 Other pancytopenia: Secondary | ICD-10-CM | POA: Diagnosis not present

## 2022-03-22 DIAGNOSIS — C9201 Acute myeloblastic leukemia, in remission: Secondary | ICD-10-CM | POA: Diagnosis not present

## 2022-03-22 DIAGNOSIS — R110 Nausea: Secondary | ICD-10-CM | POA: Diagnosis not present

## 2022-03-25 DIAGNOSIS — R110 Nausea: Secondary | ICD-10-CM | POA: Diagnosis not present

## 2022-03-25 DIAGNOSIS — D61818 Other pancytopenia: Secondary | ICD-10-CM | POA: Diagnosis not present

## 2022-03-25 DIAGNOSIS — C9201 Acute myeloblastic leukemia, in remission: Secondary | ICD-10-CM | POA: Diagnosis not present

## 2022-03-26 DIAGNOSIS — F32A Depression, unspecified: Secondary | ICD-10-CM | POA: Diagnosis not present

## 2022-03-26 DIAGNOSIS — D709 Neutropenia, unspecified: Secondary | ICD-10-CM | POA: Diagnosis not present

## 2022-03-26 DIAGNOSIS — Z85828 Personal history of other malignant neoplasm of skin: Secondary | ICD-10-CM | POA: Diagnosis not present

## 2022-03-26 DIAGNOSIS — R5383 Other fatigue: Secondary | ICD-10-CM | POA: Diagnosis not present

## 2022-03-26 DIAGNOSIS — K5900 Constipation, unspecified: Secondary | ICD-10-CM | POA: Diagnosis not present

## 2022-03-26 DIAGNOSIS — Q999 Chromosomal abnormality, unspecified: Secondary | ICD-10-CM | POA: Diagnosis not present

## 2022-03-26 DIAGNOSIS — Z9221 Personal history of antineoplastic chemotherapy: Secondary | ICD-10-CM | POA: Diagnosis not present

## 2022-03-26 DIAGNOSIS — I3139 Other pericardial effusion (noninflammatory): Secondary | ICD-10-CM | POA: Diagnosis not present

## 2022-03-26 DIAGNOSIS — L989 Disorder of the skin and subcutaneous tissue, unspecified: Secondary | ICD-10-CM | POA: Diagnosis not present

## 2022-03-26 DIAGNOSIS — R58 Hemorrhage, not elsewhere classified: Secondary | ICD-10-CM | POA: Diagnosis not present

## 2022-03-26 DIAGNOSIS — Z95828 Presence of other vascular implants and grafts: Secondary | ICD-10-CM | POA: Diagnosis not present

## 2022-03-26 DIAGNOSIS — R232 Flushing: Secondary | ICD-10-CM | POA: Diagnosis not present

## 2022-03-26 DIAGNOSIS — C9202 Acute myeloblastic leukemia, in relapse: Secondary | ICD-10-CM | POA: Diagnosis not present

## 2022-03-26 DIAGNOSIS — K1379 Other lesions of oral mucosa: Secondary | ICD-10-CM | POA: Diagnosis not present

## 2022-03-26 DIAGNOSIS — D61818 Other pancytopenia: Secondary | ICD-10-CM | POA: Diagnosis not present

## 2022-03-26 DIAGNOSIS — C9201 Acute myeloblastic leukemia, in remission: Secondary | ICD-10-CM | POA: Diagnosis not present

## 2022-03-26 DIAGNOSIS — R110 Nausea: Secondary | ICD-10-CM | POA: Diagnosis not present

## 2022-03-26 DIAGNOSIS — K068 Other specified disorders of gingiva and edentulous alveolar ridge: Secondary | ICD-10-CM | POA: Diagnosis not present

## 2022-03-29 DIAGNOSIS — R7989 Other specified abnormal findings of blood chemistry: Secondary | ICD-10-CM | POA: Diagnosis not present

## 2022-03-29 DIAGNOSIS — C9201 Acute myeloblastic leukemia, in remission: Secondary | ICD-10-CM | POA: Diagnosis not present

## 2022-03-29 DIAGNOSIS — D709 Neutropenia, unspecified: Secondary | ICD-10-CM | POA: Diagnosis not present

## 2022-03-29 DIAGNOSIS — D61818 Other pancytopenia: Secondary | ICD-10-CM | POA: Diagnosis not present

## 2022-03-30 DIAGNOSIS — D61818 Other pancytopenia: Secondary | ICD-10-CM | POA: Diagnosis not present

## 2022-03-30 DIAGNOSIS — D709 Neutropenia, unspecified: Secondary | ICD-10-CM | POA: Diagnosis not present

## 2022-03-30 DIAGNOSIS — C9201 Acute myeloblastic leukemia, in remission: Secondary | ICD-10-CM | POA: Diagnosis not present

## 2022-03-30 DIAGNOSIS — R791 Abnormal coagulation profile: Secondary | ICD-10-CM | POA: Diagnosis not present

## 2022-04-02 DIAGNOSIS — D709 Neutropenia, unspecified: Secondary | ICD-10-CM | POA: Diagnosis not present

## 2022-04-02 DIAGNOSIS — R7989 Other specified abnormal findings of blood chemistry: Secondary | ICD-10-CM | POA: Diagnosis not present

## 2022-04-02 DIAGNOSIS — D61818 Other pancytopenia: Secondary | ICD-10-CM | POA: Diagnosis not present

## 2022-04-02 DIAGNOSIS — R791 Abnormal coagulation profile: Secondary | ICD-10-CM | POA: Diagnosis not present

## 2022-04-02 DIAGNOSIS — C9201 Acute myeloblastic leukemia, in remission: Secondary | ICD-10-CM | POA: Diagnosis not present

## 2022-04-05 DIAGNOSIS — D61818 Other pancytopenia: Secondary | ICD-10-CM | POA: Diagnosis not present

## 2022-04-05 DIAGNOSIS — D709 Neutropenia, unspecified: Secondary | ICD-10-CM | POA: Diagnosis not present

## 2022-04-05 DIAGNOSIS — Z79899 Other long term (current) drug therapy: Secondary | ICD-10-CM | POA: Diagnosis not present

## 2022-04-05 DIAGNOSIS — C9201 Acute myeloblastic leukemia, in remission: Secondary | ICD-10-CM | POA: Diagnosis not present

## 2022-04-09 DIAGNOSIS — F32A Depression, unspecified: Secondary | ICD-10-CM | POA: Diagnosis not present

## 2022-04-09 DIAGNOSIS — Z85828 Personal history of other malignant neoplasm of skin: Secondary | ICD-10-CM | POA: Diagnosis not present

## 2022-04-09 DIAGNOSIS — I3139 Other pericardial effusion (noninflammatory): Secondary | ICD-10-CM | POA: Diagnosis not present

## 2022-04-09 DIAGNOSIS — M545 Low back pain, unspecified: Secondary | ICD-10-CM | POA: Diagnosis not present

## 2022-04-09 DIAGNOSIS — C9202 Acute myeloblastic leukemia, in relapse: Secondary | ICD-10-CM | POA: Diagnosis not present

## 2022-04-09 DIAGNOSIS — K5900 Constipation, unspecified: Secondary | ICD-10-CM | POA: Diagnosis not present

## 2022-04-09 DIAGNOSIS — R55 Syncope and collapse: Secondary | ICD-10-CM | POA: Diagnosis not present

## 2022-04-09 DIAGNOSIS — Z95828 Presence of other vascular implants and grafts: Secondary | ICD-10-CM | POA: Diagnosis not present

## 2022-04-09 DIAGNOSIS — C9201 Acute myeloblastic leukemia, in remission: Secondary | ICD-10-CM | POA: Diagnosis not present

## 2022-04-09 DIAGNOSIS — R110 Nausea: Secondary | ICD-10-CM | POA: Diagnosis not present

## 2022-04-09 DIAGNOSIS — L989 Disorder of the skin and subcutaneous tissue, unspecified: Secondary | ICD-10-CM | POA: Diagnosis not present

## 2022-04-09 DIAGNOSIS — D61818 Other pancytopenia: Secondary | ICD-10-CM | POA: Diagnosis not present

## 2022-04-09 DIAGNOSIS — R232 Flushing: Secondary | ICD-10-CM | POA: Diagnosis not present

## 2022-04-09 DIAGNOSIS — E538 Deficiency of other specified B group vitamins: Secondary | ICD-10-CM | POA: Diagnosis not present

## 2022-04-10 DIAGNOSIS — C9201 Acute myeloblastic leukemia, in remission: Secondary | ICD-10-CM | POA: Diagnosis not present

## 2022-04-10 DIAGNOSIS — Z5111 Encounter for antineoplastic chemotherapy: Secondary | ICD-10-CM | POA: Diagnosis not present

## 2022-04-11 DIAGNOSIS — C9201 Acute myeloblastic leukemia, in remission: Secondary | ICD-10-CM | POA: Diagnosis not present

## 2022-04-11 DIAGNOSIS — Z5111 Encounter for antineoplastic chemotherapy: Secondary | ICD-10-CM | POA: Diagnosis not present

## 2022-04-12 DIAGNOSIS — D61818 Other pancytopenia: Secondary | ICD-10-CM | POA: Diagnosis not present

## 2022-04-12 DIAGNOSIS — Z5111 Encounter for antineoplastic chemotherapy: Secondary | ICD-10-CM | POA: Diagnosis not present

## 2022-04-12 DIAGNOSIS — C9201 Acute myeloblastic leukemia, in remission: Secondary | ICD-10-CM | POA: Diagnosis not present

## 2022-04-13 DIAGNOSIS — Z5111 Encounter for antineoplastic chemotherapy: Secondary | ICD-10-CM | POA: Diagnosis not present

## 2022-04-13 DIAGNOSIS — D61818 Other pancytopenia: Secondary | ICD-10-CM | POA: Diagnosis not present

## 2022-04-13 DIAGNOSIS — C9201 Acute myeloblastic leukemia, in remission: Secondary | ICD-10-CM | POA: Diagnosis not present

## 2022-04-13 DIAGNOSIS — C9202 Acute myeloblastic leukemia, in relapse: Secondary | ICD-10-CM | POA: Diagnosis not present

## 2022-04-13 DIAGNOSIS — K068 Other specified disorders of gingiva and edentulous alveolar ridge: Secondary | ICD-10-CM | POA: Diagnosis not present

## 2022-04-16 DIAGNOSIS — C9201 Acute myeloblastic leukemia, in remission: Secondary | ICD-10-CM | POA: Diagnosis not present

## 2022-04-16 DIAGNOSIS — D61818 Other pancytopenia: Secondary | ICD-10-CM | POA: Diagnosis not present

## 2022-04-16 DIAGNOSIS — C9202 Acute myeloblastic leukemia, in relapse: Secondary | ICD-10-CM | POA: Diagnosis not present

## 2022-04-16 DIAGNOSIS — R7989 Other specified abnormal findings of blood chemistry: Secondary | ICD-10-CM | POA: Diagnosis not present

## 2022-04-19 DIAGNOSIS — R7989 Other specified abnormal findings of blood chemistry: Secondary | ICD-10-CM | POA: Diagnosis not present

## 2022-04-19 DIAGNOSIS — C9201 Acute myeloblastic leukemia, in remission: Secondary | ICD-10-CM | POA: Diagnosis not present

## 2022-04-19 DIAGNOSIS — D61818 Other pancytopenia: Secondary | ICD-10-CM | POA: Diagnosis not present

## 2022-04-21 IMAGING — MG DIGITAL SCREENING BILAT W/ TOMO W/ CAD
8 series · 9 of 24 positions shown · non-contrast
Comparison: Previous exam(s).

CLINICAL DATA: Screening.

EXAM:
DIGITAL SCREENING BILATERAL MAMMOGRAM WITH TOMO AND CAD

[R MLO synth-2D]
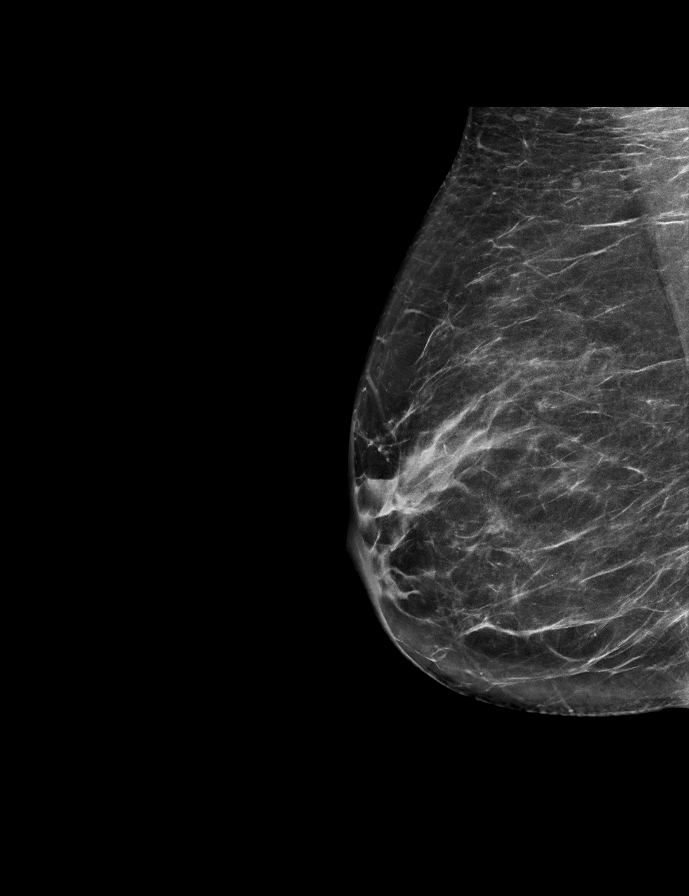

[L CC synth-2D]
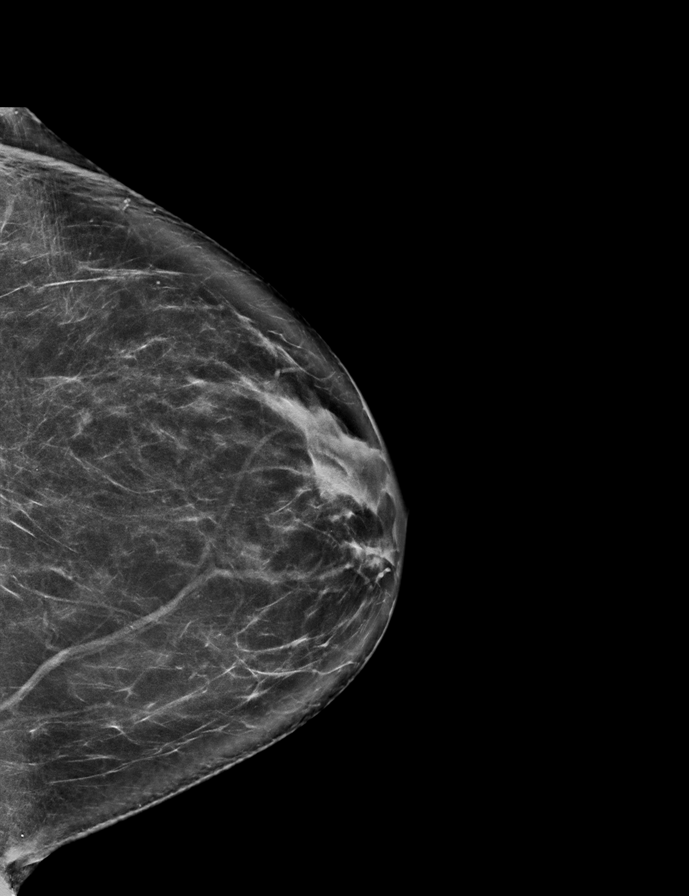

[L MLO synth-2D]
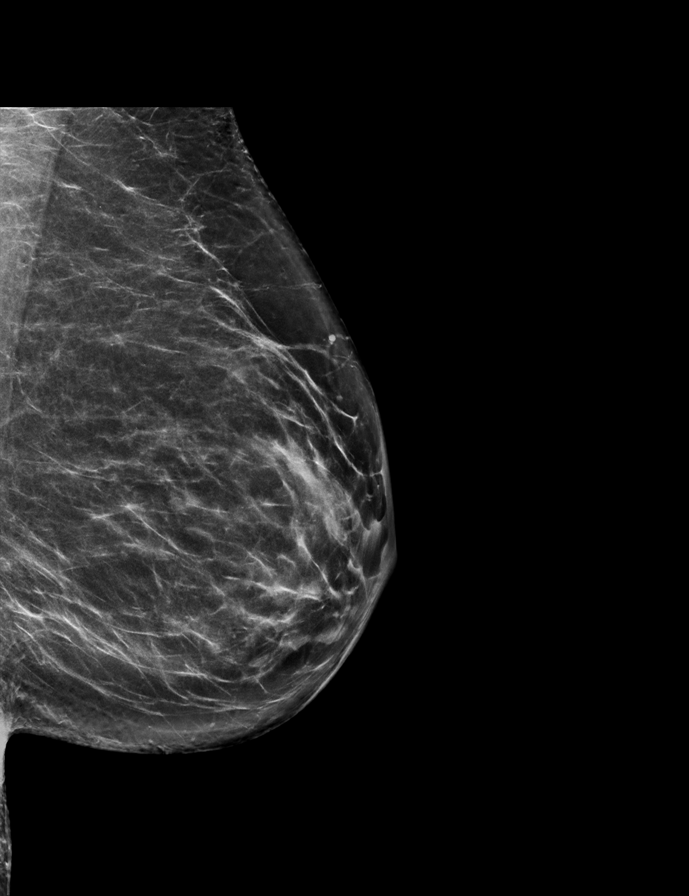

[R CC synth-2D]
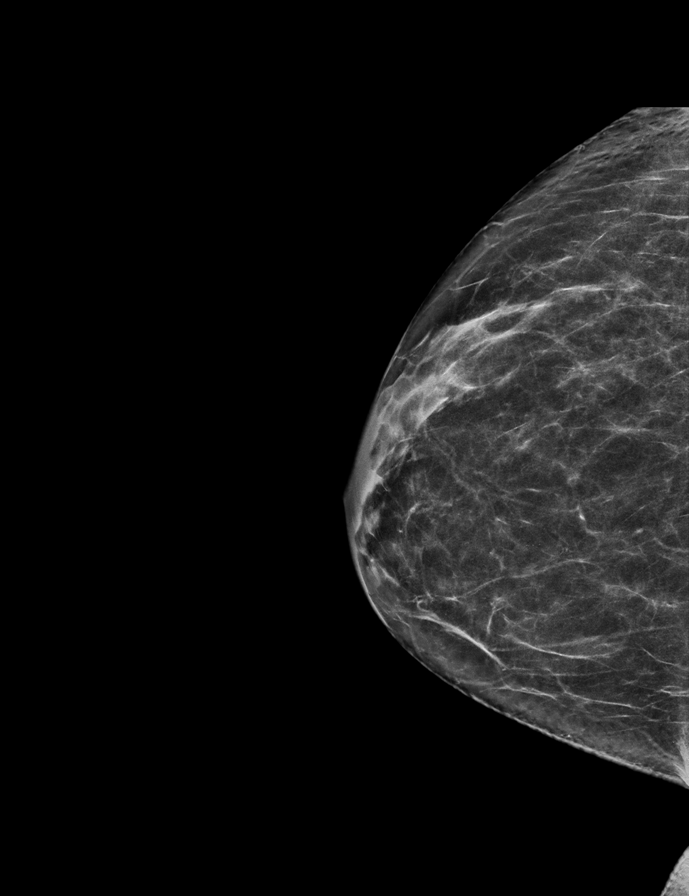

[R MLO tomo · 2 of 73 frames shown]
[frame 24/73]
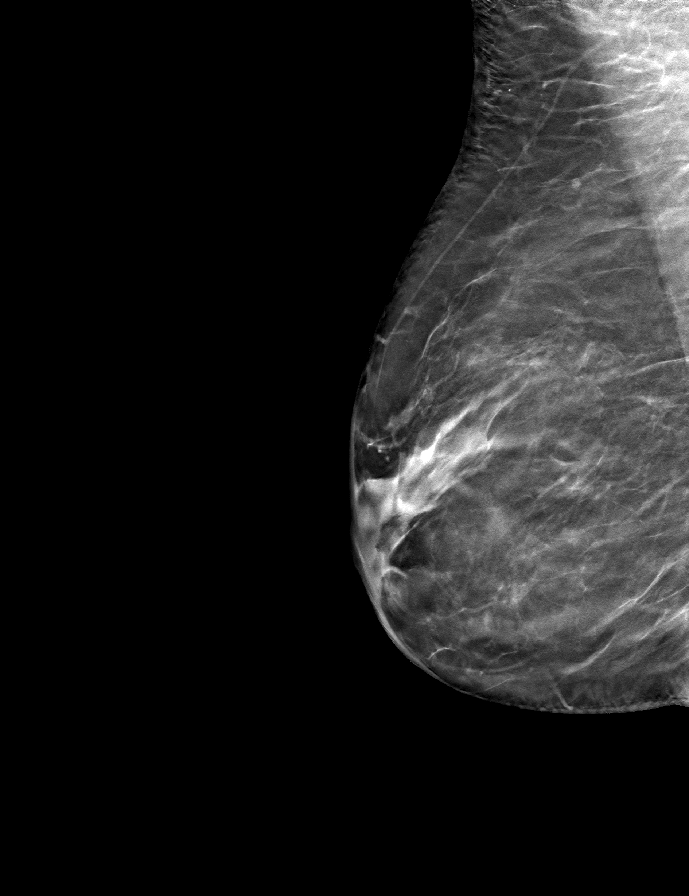
[frame 37/73]
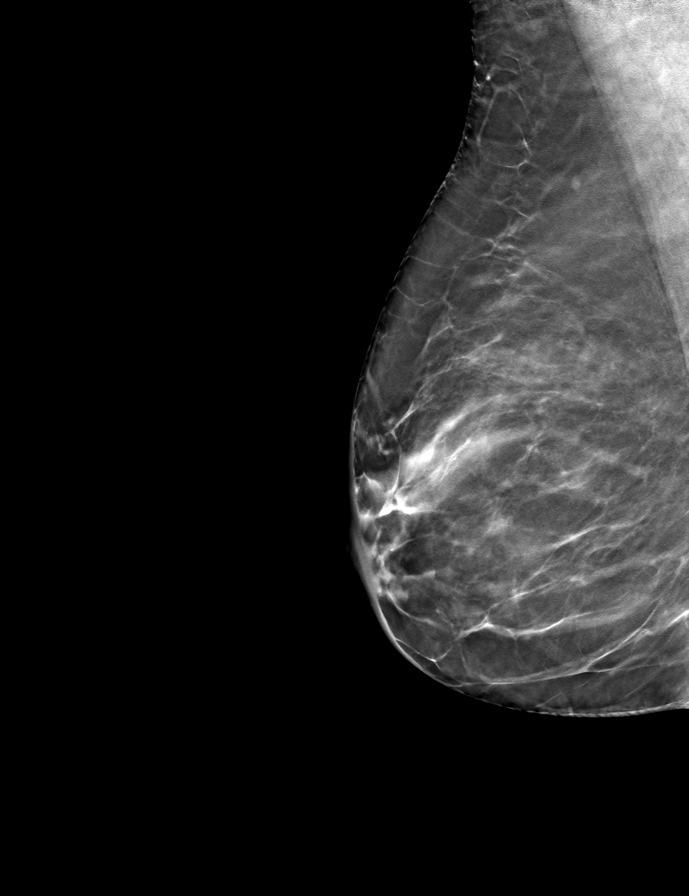

[L CC tomo · tomo slice 37/73.0]
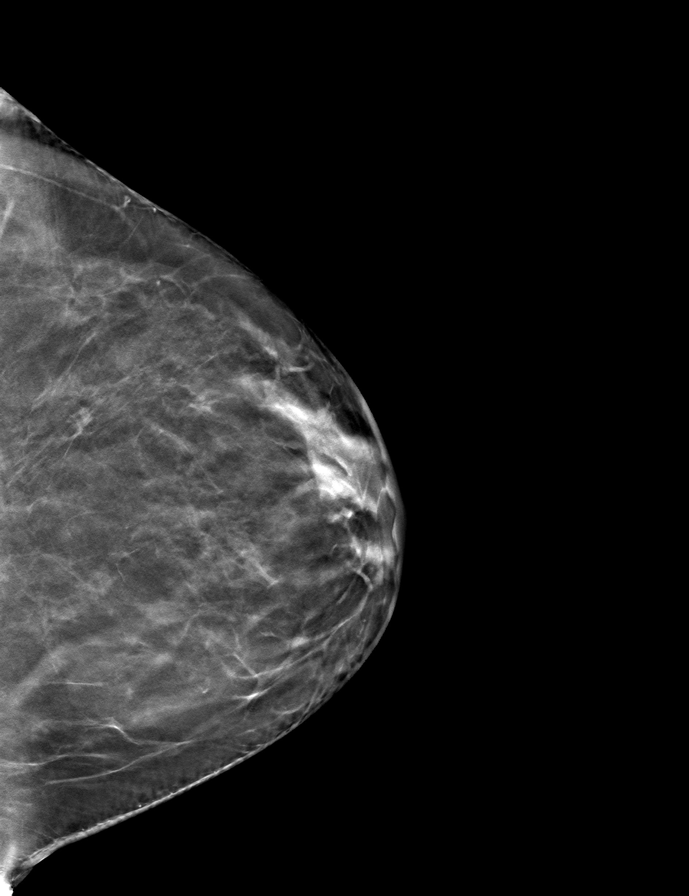

[R CC tomo · tomo slice 31/62.0]
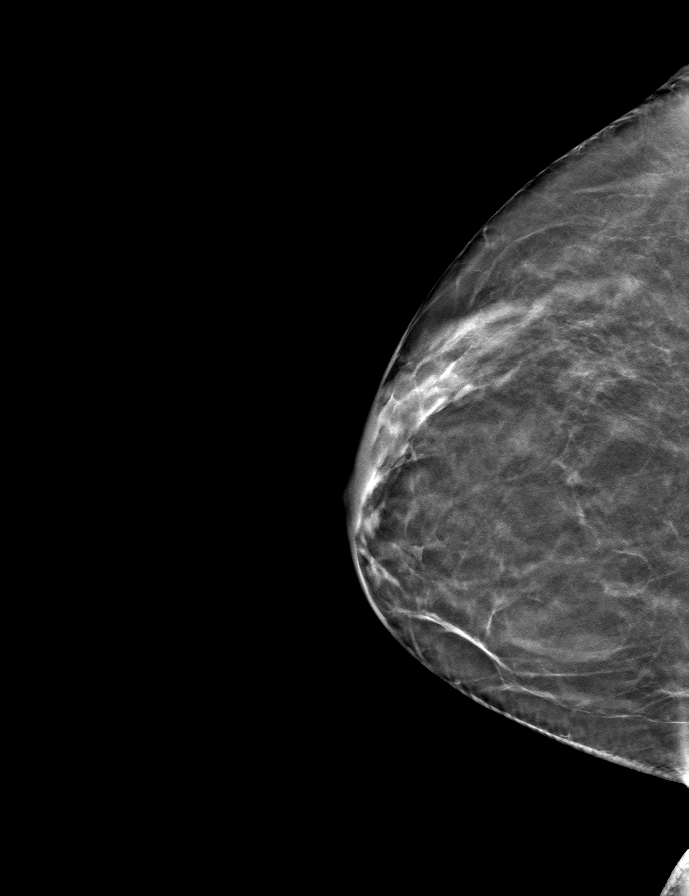

[L MLO tomo · tomo slice 37/73.0]
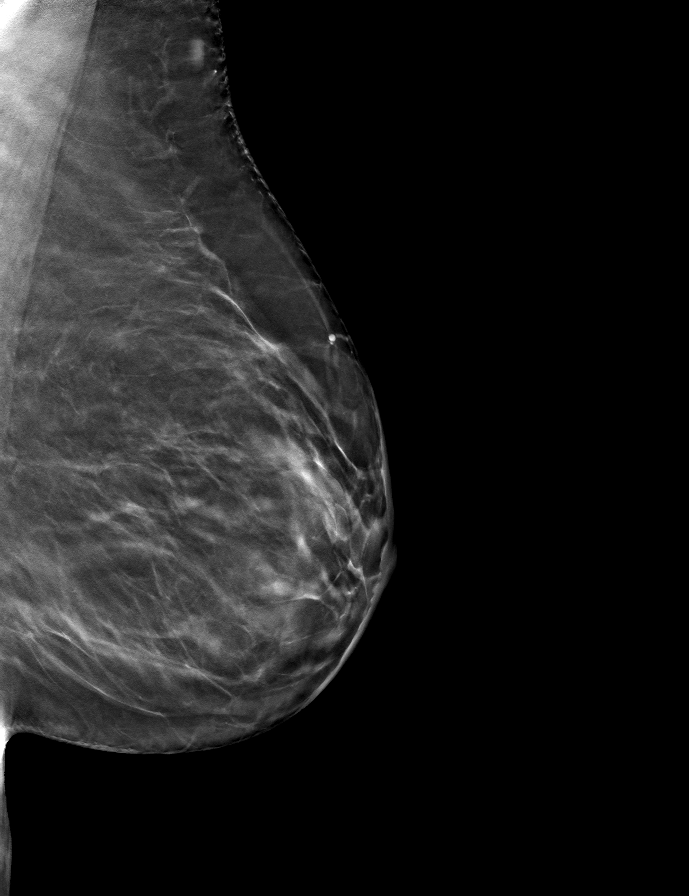

[9 of 24 positions shown; findings below may reference images not displayed]

ACR Breast Density Category b: There are scattered areas of
fibroglandular density.
FINDINGS: There are no findings suspicious for malignancy. Images were
processed with CAD.
IMPRESSION: No mammographic evidence of malignancy. A result letter of this
screening mammogram will be mailed directly to the patient.

RECOMMENDATION:
Screening mammogram in one year. (Code:[id number])

BI-RADS CATEGORY  1: Negative.

## 2022-04-23 DIAGNOSIS — C9201 Acute myeloblastic leukemia, in remission: Secondary | ICD-10-CM | POA: Diagnosis not present

## 2022-04-23 DIAGNOSIS — D61818 Other pancytopenia: Secondary | ICD-10-CM | POA: Diagnosis not present

## 2022-04-23 DIAGNOSIS — Z79899 Other long term (current) drug therapy: Secondary | ICD-10-CM | POA: Diagnosis not present

## 2022-04-26 DIAGNOSIS — R7989 Other specified abnormal findings of blood chemistry: Secondary | ICD-10-CM | POA: Diagnosis not present

## 2022-04-26 DIAGNOSIS — C9201 Acute myeloblastic leukemia, in remission: Secondary | ICD-10-CM | POA: Diagnosis not present

## 2022-04-26 DIAGNOSIS — D61818 Other pancytopenia: Secondary | ICD-10-CM | POA: Diagnosis not present

## 2022-04-30 DIAGNOSIS — C9202 Acute myeloblastic leukemia, in relapse: Secondary | ICD-10-CM | POA: Diagnosis not present

## 2022-04-30 DIAGNOSIS — D61818 Other pancytopenia: Secondary | ICD-10-CM | POA: Diagnosis not present

## 2022-04-30 DIAGNOSIS — Z79899 Other long term (current) drug therapy: Secondary | ICD-10-CM | POA: Diagnosis not present

## 2022-05-03 DIAGNOSIS — D709 Neutropenia, unspecified: Secondary | ICD-10-CM | POA: Diagnosis not present

## 2022-05-03 DIAGNOSIS — K5900 Constipation, unspecified: Secondary | ICD-10-CM | POA: Diagnosis not present

## 2022-05-03 DIAGNOSIS — C9202 Acute myeloblastic leukemia, in relapse: Secondary | ICD-10-CM | POA: Diagnosis not present

## 2022-05-07 DIAGNOSIS — C9201 Acute myeloblastic leukemia, in remission: Secondary | ICD-10-CM | POA: Diagnosis not present

## 2022-05-07 DIAGNOSIS — D61818 Other pancytopenia: Secondary | ICD-10-CM | POA: Diagnosis not present

## 2022-05-07 DIAGNOSIS — Z5181 Encounter for therapeutic drug level monitoring: Secondary | ICD-10-CM | POA: Diagnosis not present

## 2022-05-07 DIAGNOSIS — Z5111 Encounter for antineoplastic chemotherapy: Secondary | ICD-10-CM | POA: Diagnosis not present

## 2022-05-08 DIAGNOSIS — Z5111 Encounter for antineoplastic chemotherapy: Secondary | ICD-10-CM | POA: Diagnosis not present

## 2022-05-08 DIAGNOSIS — C9201 Acute myeloblastic leukemia, in remission: Secondary | ICD-10-CM | POA: Diagnosis not present

## 2022-05-09 DIAGNOSIS — C9201 Acute myeloblastic leukemia, in remission: Secondary | ICD-10-CM | POA: Diagnosis not present

## 2022-05-09 DIAGNOSIS — Z5111 Encounter for antineoplastic chemotherapy: Secondary | ICD-10-CM | POA: Diagnosis not present

## 2022-05-10 DIAGNOSIS — C9201 Acute myeloblastic leukemia, in remission: Secondary | ICD-10-CM | POA: Diagnosis not present

## 2022-05-10 DIAGNOSIS — Z5111 Encounter for antineoplastic chemotherapy: Secondary | ICD-10-CM | POA: Diagnosis not present

## 2022-05-11 DIAGNOSIS — Z5111 Encounter for antineoplastic chemotherapy: Secondary | ICD-10-CM | POA: Diagnosis not present

## 2022-05-11 DIAGNOSIS — C9201 Acute myeloblastic leukemia, in remission: Secondary | ICD-10-CM | POA: Diagnosis not present

## 2022-05-14 DIAGNOSIS — R232 Flushing: Secondary | ICD-10-CM | POA: Diagnosis not present

## 2022-05-14 DIAGNOSIS — C9202 Acute myeloblastic leukemia, in relapse: Secondary | ICD-10-CM | POA: Diagnosis not present

## 2022-05-14 DIAGNOSIS — R110 Nausea: Secondary | ICD-10-CM | POA: Diagnosis not present

## 2022-05-14 DIAGNOSIS — L299 Pruritus, unspecified: Secondary | ICD-10-CM | POA: Diagnosis not present

## 2022-05-14 DIAGNOSIS — I3139 Other pericardial effusion (noninflammatory): Secondary | ICD-10-CM | POA: Diagnosis not present

## 2022-05-14 DIAGNOSIS — D61818 Other pancytopenia: Secondary | ICD-10-CM | POA: Diagnosis not present

## 2022-05-14 DIAGNOSIS — C9200 Acute myeloblastic leukemia, not having achieved remission: Secondary | ICD-10-CM | POA: Diagnosis not present

## 2022-05-14 DIAGNOSIS — K068 Other specified disorders of gingiva and edentulous alveolar ridge: Secondary | ICD-10-CM | POA: Diagnosis not present

## 2022-05-14 DIAGNOSIS — I959 Hypotension, unspecified: Secondary | ICD-10-CM | POA: Diagnosis not present

## 2022-05-14 DIAGNOSIS — L989 Disorder of the skin and subcutaneous tissue, unspecified: Secondary | ICD-10-CM | POA: Diagnosis not present

## 2022-05-14 DIAGNOSIS — E538 Deficiency of other specified B group vitamins: Secondary | ICD-10-CM | POA: Diagnosis not present

## 2022-05-14 DIAGNOSIS — K5900 Constipation, unspecified: Secondary | ICD-10-CM | POA: Diagnosis not present

## 2022-05-14 DIAGNOSIS — L509 Urticaria, unspecified: Secondary | ICD-10-CM | POA: Diagnosis not present

## 2022-05-14 DIAGNOSIS — D709 Neutropenia, unspecified: Secondary | ICD-10-CM | POA: Diagnosis not present

## 2022-05-14 DIAGNOSIS — R55 Syncope and collapse: Secondary | ICD-10-CM | POA: Diagnosis not present

## 2022-05-14 DIAGNOSIS — R42 Dizziness and giddiness: Secondary | ICD-10-CM | POA: Diagnosis not present

## 2022-05-17 DIAGNOSIS — Z79899 Other long term (current) drug therapy: Secondary | ICD-10-CM | POA: Diagnosis not present

## 2022-05-17 DIAGNOSIS — Z23 Encounter for immunization: Secondary | ICD-10-CM | POA: Diagnosis not present

## 2022-05-17 DIAGNOSIS — C9202 Acute myeloblastic leukemia, in relapse: Secondary | ICD-10-CM | POA: Diagnosis not present

## 2022-05-17 DIAGNOSIS — D61818 Other pancytopenia: Secondary | ICD-10-CM | POA: Diagnosis not present

## 2022-05-21 DIAGNOSIS — Z79899 Other long term (current) drug therapy: Secondary | ICD-10-CM | POA: Diagnosis not present

## 2022-05-21 DIAGNOSIS — C9202 Acute myeloblastic leukemia, in relapse: Secondary | ICD-10-CM | POA: Diagnosis not present

## 2022-05-21 DIAGNOSIS — D61818 Other pancytopenia: Secondary | ICD-10-CM | POA: Diagnosis not present

## 2022-05-24 DIAGNOSIS — C9202 Acute myeloblastic leukemia, in relapse: Secondary | ICD-10-CM | POA: Diagnosis not present

## 2022-05-24 DIAGNOSIS — Z79899 Other long term (current) drug therapy: Secondary | ICD-10-CM | POA: Diagnosis not present

## 2022-05-24 DIAGNOSIS — D61818 Other pancytopenia: Secondary | ICD-10-CM | POA: Diagnosis not present

## 2022-05-28 DIAGNOSIS — R232 Flushing: Secondary | ICD-10-CM | POA: Diagnosis not present

## 2022-05-28 DIAGNOSIS — E538 Deficiency of other specified B group vitamins: Secondary | ICD-10-CM | POA: Diagnosis not present

## 2022-05-28 DIAGNOSIS — Z85828 Personal history of other malignant neoplasm of skin: Secondary | ICD-10-CM | POA: Diagnosis not present

## 2022-05-28 DIAGNOSIS — R21 Rash and other nonspecific skin eruption: Secondary | ICD-10-CM | POA: Diagnosis not present

## 2022-05-28 DIAGNOSIS — R110 Nausea: Secondary | ICD-10-CM | POA: Diagnosis not present

## 2022-05-28 DIAGNOSIS — C9202 Acute myeloblastic leukemia, in relapse: Secondary | ICD-10-CM | POA: Diagnosis not present

## 2022-05-28 DIAGNOSIS — I3139 Other pericardial effusion (noninflammatory): Secondary | ICD-10-CM | POA: Diagnosis not present

## 2022-05-28 DIAGNOSIS — Z8616 Personal history of COVID-19: Secondary | ICD-10-CM | POA: Diagnosis not present

## 2022-05-28 DIAGNOSIS — D61818 Other pancytopenia: Secondary | ICD-10-CM | POA: Diagnosis not present

## 2022-05-28 DIAGNOSIS — F32A Depression, unspecified: Secondary | ICD-10-CM | POA: Diagnosis not present

## 2022-05-28 DIAGNOSIS — Z95828 Presence of other vascular implants and grafts: Secondary | ICD-10-CM | POA: Diagnosis not present

## 2022-05-28 DIAGNOSIS — R55 Syncope and collapse: Secondary | ICD-10-CM | POA: Diagnosis not present

## 2022-05-28 DIAGNOSIS — L989 Disorder of the skin and subcutaneous tissue, unspecified: Secondary | ICD-10-CM | POA: Diagnosis not present

## 2022-05-28 DIAGNOSIS — K5900 Constipation, unspecified: Secondary | ICD-10-CM | POA: Diagnosis not present

## 2022-05-28 DIAGNOSIS — D709 Neutropenia, unspecified: Secondary | ICD-10-CM | POA: Diagnosis not present

## 2022-05-28 DIAGNOSIS — Q999 Chromosomal abnormality, unspecified: Secondary | ICD-10-CM | POA: Diagnosis not present

## 2022-05-31 DIAGNOSIS — D709 Neutropenia, unspecified: Secondary | ICD-10-CM | POA: Diagnosis not present

## 2022-05-31 DIAGNOSIS — Z79899 Other long term (current) drug therapy: Secondary | ICD-10-CM | POA: Diagnosis not present

## 2022-05-31 DIAGNOSIS — D61818 Other pancytopenia: Secondary | ICD-10-CM | POA: Diagnosis not present

## 2022-05-31 DIAGNOSIS — C9202 Acute myeloblastic leukemia, in relapse: Secondary | ICD-10-CM | POA: Diagnosis not present

## 2022-06-04 DIAGNOSIS — Z95828 Presence of other vascular implants and grafts: Secondary | ICD-10-CM | POA: Diagnosis not present

## 2022-06-04 DIAGNOSIS — D61818 Other pancytopenia: Secondary | ICD-10-CM | POA: Diagnosis not present

## 2022-06-04 DIAGNOSIS — Z5111 Encounter for antineoplastic chemotherapy: Secondary | ICD-10-CM | POA: Diagnosis not present

## 2022-06-04 DIAGNOSIS — C9202 Acute myeloblastic leukemia, in relapse: Secondary | ICD-10-CM | POA: Diagnosis not present

## 2022-06-05 DIAGNOSIS — C9202 Acute myeloblastic leukemia, in relapse: Secondary | ICD-10-CM | POA: Diagnosis not present

## 2022-06-05 DIAGNOSIS — Z5111 Encounter for antineoplastic chemotherapy: Secondary | ICD-10-CM | POA: Diagnosis not present

## 2022-06-05 DIAGNOSIS — Z95828 Presence of other vascular implants and grafts: Secondary | ICD-10-CM | POA: Diagnosis not present

## 2022-06-06 DIAGNOSIS — Z5111 Encounter for antineoplastic chemotherapy: Secondary | ICD-10-CM | POA: Diagnosis not present

## 2022-06-06 DIAGNOSIS — Z79899 Other long term (current) drug therapy: Secondary | ICD-10-CM | POA: Diagnosis not present

## 2022-06-06 DIAGNOSIS — C9202 Acute myeloblastic leukemia, in relapse: Secondary | ICD-10-CM | POA: Diagnosis not present

## 2022-06-07 DIAGNOSIS — C9202 Acute myeloblastic leukemia, in relapse: Secondary | ICD-10-CM | POA: Diagnosis not present

## 2022-06-07 DIAGNOSIS — Z95828 Presence of other vascular implants and grafts: Secondary | ICD-10-CM | POA: Diagnosis not present

## 2022-06-07 DIAGNOSIS — Z5111 Encounter for antineoplastic chemotherapy: Secondary | ICD-10-CM | POA: Diagnosis not present

## 2022-06-07 DIAGNOSIS — D61818 Other pancytopenia: Secondary | ICD-10-CM | POA: Diagnosis not present

## 2022-06-08 DIAGNOSIS — Z5111 Encounter for antineoplastic chemotherapy: Secondary | ICD-10-CM | POA: Diagnosis not present

## 2022-06-08 DIAGNOSIS — Z95828 Presence of other vascular implants and grafts: Secondary | ICD-10-CM | POA: Diagnosis not present

## 2022-06-08 DIAGNOSIS — C9202 Acute myeloblastic leukemia, in relapse: Secondary | ICD-10-CM | POA: Diagnosis not present

## 2022-06-11 DIAGNOSIS — R233 Spontaneous ecchymoses: Secondary | ICD-10-CM | POA: Diagnosis not present

## 2022-06-11 DIAGNOSIS — R21 Rash and other nonspecific skin eruption: Secondary | ICD-10-CM | POA: Diagnosis not present

## 2022-06-11 DIAGNOSIS — K068 Other specified disorders of gingiva and edentulous alveolar ridge: Secondary | ICD-10-CM | POA: Diagnosis not present

## 2022-06-11 DIAGNOSIS — D61818 Other pancytopenia: Secondary | ICD-10-CM | POA: Diagnosis not present

## 2022-06-11 DIAGNOSIS — Q999 Chromosomal abnormality, unspecified: Secondary | ICD-10-CM | POA: Diagnosis not present

## 2022-06-11 DIAGNOSIS — D692 Other nonthrombocytopenic purpura: Secondary | ICD-10-CM | POA: Diagnosis not present

## 2022-06-11 DIAGNOSIS — R110 Nausea: Secondary | ICD-10-CM | POA: Diagnosis not present

## 2022-06-11 DIAGNOSIS — R55 Syncope and collapse: Secondary | ICD-10-CM | POA: Diagnosis not present

## 2022-06-11 DIAGNOSIS — D709 Neutropenia, unspecified: Secondary | ICD-10-CM | POA: Diagnosis not present

## 2022-06-11 DIAGNOSIS — E538 Deficiency of other specified B group vitamins: Secondary | ICD-10-CM | POA: Diagnosis not present

## 2022-06-11 DIAGNOSIS — K5900 Constipation, unspecified: Secondary | ICD-10-CM | POA: Diagnosis not present

## 2022-06-11 DIAGNOSIS — I3139 Other pericardial effusion (noninflammatory): Secondary | ICD-10-CM | POA: Diagnosis not present

## 2022-06-11 DIAGNOSIS — R232 Flushing: Secondary | ICD-10-CM | POA: Diagnosis not present

## 2022-06-11 DIAGNOSIS — C9202 Acute myeloblastic leukemia, in relapse: Secondary | ICD-10-CM | POA: Diagnosis not present

## 2022-06-11 DIAGNOSIS — L989 Disorder of the skin and subcutaneous tissue, unspecified: Secondary | ICD-10-CM | POA: Diagnosis not present

## 2022-06-14 DIAGNOSIS — D61818 Other pancytopenia: Secondary | ICD-10-CM | POA: Diagnosis not present

## 2022-06-14 DIAGNOSIS — C9202 Acute myeloblastic leukemia, in relapse: Secondary | ICD-10-CM | POA: Diagnosis not present

## 2022-06-14 DIAGNOSIS — Z79899 Other long term (current) drug therapy: Secondary | ICD-10-CM | POA: Diagnosis not present

## 2022-06-14 DIAGNOSIS — D709 Neutropenia, unspecified: Secondary | ICD-10-CM | POA: Diagnosis not present

## 2022-06-18 DIAGNOSIS — C9202 Acute myeloblastic leukemia, in relapse: Secondary | ICD-10-CM | POA: Diagnosis not present

## 2022-06-18 DIAGNOSIS — D61818 Other pancytopenia: Secondary | ICD-10-CM | POA: Diagnosis not present

## 2022-06-18 DIAGNOSIS — Z79899 Other long term (current) drug therapy: Secondary | ICD-10-CM | POA: Diagnosis not present

## 2022-06-18 DIAGNOSIS — D709 Neutropenia, unspecified: Secondary | ICD-10-CM | POA: Diagnosis not present

## 2022-06-19 DIAGNOSIS — M19071 Primary osteoarthritis, right ankle and foot: Secondary | ICD-10-CM | POA: Diagnosis not present

## 2022-06-19 DIAGNOSIS — M2012 Hallux valgus (acquired), left foot: Secondary | ICD-10-CM | POA: Diagnosis not present

## 2022-06-19 DIAGNOSIS — M2011 Hallux valgus (acquired), right foot: Secondary | ICD-10-CM | POA: Diagnosis not present

## 2022-06-19 DIAGNOSIS — M19072 Primary osteoarthritis, left ankle and foot: Secondary | ICD-10-CM | POA: Diagnosis not present

## 2022-06-21 DIAGNOSIS — D709 Neutropenia, unspecified: Secondary | ICD-10-CM | POA: Diagnosis not present

## 2022-06-21 DIAGNOSIS — C9202 Acute myeloblastic leukemia, in relapse: Secondary | ICD-10-CM | POA: Diagnosis not present

## 2022-06-21 DIAGNOSIS — Z79899 Other long term (current) drug therapy: Secondary | ICD-10-CM | POA: Diagnosis not present

## 2022-06-21 DIAGNOSIS — D61818 Other pancytopenia: Secondary | ICD-10-CM | POA: Diagnosis not present

## 2022-06-21 DIAGNOSIS — I340 Nonrheumatic mitral (valve) insufficiency: Secondary | ICD-10-CM | POA: Diagnosis not present

## 2022-06-25 DIAGNOSIS — L989 Disorder of the skin and subcutaneous tissue, unspecified: Secondary | ICD-10-CM | POA: Diagnosis not present

## 2022-06-25 DIAGNOSIS — F32A Depression, unspecified: Secondary | ICD-10-CM | POA: Diagnosis not present

## 2022-06-25 DIAGNOSIS — R42 Dizziness and giddiness: Secondary | ICD-10-CM | POA: Diagnosis not present

## 2022-06-25 DIAGNOSIS — I959 Hypotension, unspecified: Secondary | ICD-10-CM | POA: Diagnosis not present

## 2022-06-25 DIAGNOSIS — L509 Urticaria, unspecified: Secondary | ICD-10-CM | POA: Diagnosis not present

## 2022-06-25 DIAGNOSIS — Z95828 Presence of other vascular implants and grafts: Secondary | ICD-10-CM | POA: Diagnosis not present

## 2022-06-25 DIAGNOSIS — Z8616 Personal history of COVID-19: Secondary | ICD-10-CM | POA: Diagnosis not present

## 2022-06-25 DIAGNOSIS — Z79624 Long term (current) use of inhibitors of nucleotide synthesis: Secondary | ICD-10-CM | POA: Diagnosis not present

## 2022-06-25 DIAGNOSIS — D61818 Other pancytopenia: Secondary | ICD-10-CM | POA: Diagnosis not present

## 2022-06-25 DIAGNOSIS — R55 Syncope and collapse: Secondary | ICD-10-CM | POA: Diagnosis not present

## 2022-06-25 DIAGNOSIS — C9202 Acute myeloblastic leukemia, in relapse: Secondary | ICD-10-CM | POA: Diagnosis not present

## 2022-06-25 DIAGNOSIS — R232 Flushing: Secondary | ICD-10-CM | POA: Diagnosis not present

## 2022-06-25 DIAGNOSIS — Z85828 Personal history of other malignant neoplasm of skin: Secondary | ICD-10-CM | POA: Diagnosis not present

## 2022-06-25 DIAGNOSIS — R110 Nausea: Secondary | ICD-10-CM | POA: Diagnosis not present

## 2022-06-25 DIAGNOSIS — K5900 Constipation, unspecified: Secondary | ICD-10-CM | POA: Diagnosis not present

## 2022-06-28 DIAGNOSIS — D61818 Other pancytopenia: Secondary | ICD-10-CM | POA: Diagnosis not present

## 2022-06-28 DIAGNOSIS — C9202 Acute myeloblastic leukemia, in relapse: Secondary | ICD-10-CM | POA: Diagnosis not present

## 2022-06-28 DIAGNOSIS — Z79899 Other long term (current) drug therapy: Secondary | ICD-10-CM | POA: Diagnosis not present

## 2022-06-28 DIAGNOSIS — D709 Neutropenia, unspecified: Secondary | ICD-10-CM | POA: Diagnosis not present

## 2022-07-01 DIAGNOSIS — C9202 Acute myeloblastic leukemia, in relapse: Secondary | ICD-10-CM | POA: Diagnosis not present

## 2022-07-02 DIAGNOSIS — K5900 Constipation, unspecified: Secondary | ICD-10-CM | POA: Diagnosis not present

## 2022-07-02 DIAGNOSIS — C9202 Acute myeloblastic leukemia, in relapse: Secondary | ICD-10-CM | POA: Diagnosis not present

## 2022-07-02 DIAGNOSIS — R42 Dizziness and giddiness: Secondary | ICD-10-CM | POA: Diagnosis not present

## 2022-07-02 DIAGNOSIS — Z9889 Other specified postprocedural states: Secondary | ICD-10-CM | POA: Diagnosis not present

## 2022-07-02 DIAGNOSIS — R439 Unspecified disturbances of smell and taste: Secondary | ICD-10-CM | POA: Diagnosis not present

## 2022-07-02 DIAGNOSIS — L989 Disorder of the skin and subcutaneous tissue, unspecified: Secondary | ICD-10-CM | POA: Diagnosis not present

## 2022-07-02 DIAGNOSIS — Z95828 Presence of other vascular implants and grafts: Secondary | ICD-10-CM | POA: Diagnosis not present

## 2022-07-02 DIAGNOSIS — D61818 Other pancytopenia: Secondary | ICD-10-CM | POA: Diagnosis not present

## 2022-07-02 DIAGNOSIS — R791 Abnormal coagulation profile: Secondary | ICD-10-CM | POA: Diagnosis not present

## 2022-07-02 DIAGNOSIS — F419 Anxiety disorder, unspecified: Secondary | ICD-10-CM | POA: Diagnosis not present

## 2022-07-02 DIAGNOSIS — R232 Flushing: Secondary | ICD-10-CM | POA: Diagnosis not present

## 2022-07-02 DIAGNOSIS — Z79899 Other long term (current) drug therapy: Secondary | ICD-10-CM | POA: Diagnosis not present

## 2022-07-02 DIAGNOSIS — D709 Neutropenia, unspecified: Secondary | ICD-10-CM | POA: Diagnosis not present

## 2022-07-02 DIAGNOSIS — F32A Depression, unspecified: Secondary | ICD-10-CM | POA: Diagnosis not present

## 2022-07-02 DIAGNOSIS — I340 Nonrheumatic mitral (valve) insufficiency: Secondary | ICD-10-CM | POA: Diagnosis not present

## 2022-07-02 DIAGNOSIS — G252 Other specified forms of tremor: Secondary | ICD-10-CM | POA: Diagnosis not present

## 2022-07-03 DIAGNOSIS — C9202 Acute myeloblastic leukemia, in relapse: Secondary | ICD-10-CM | POA: Diagnosis not present

## 2022-07-03 DIAGNOSIS — Z5111 Encounter for antineoplastic chemotherapy: Secondary | ICD-10-CM | POA: Diagnosis not present

## 2022-07-04 DIAGNOSIS — Z5111 Encounter for antineoplastic chemotherapy: Secondary | ICD-10-CM | POA: Diagnosis not present

## 2022-07-04 DIAGNOSIS — C9202 Acute myeloblastic leukemia, in relapse: Secondary | ICD-10-CM | POA: Diagnosis not present

## 2022-07-05 DIAGNOSIS — D61818 Other pancytopenia: Secondary | ICD-10-CM | POA: Diagnosis not present

## 2022-07-05 DIAGNOSIS — Z79899 Other long term (current) drug therapy: Secondary | ICD-10-CM | POA: Diagnosis not present

## 2022-07-05 DIAGNOSIS — Z85828 Personal history of other malignant neoplasm of skin: Secondary | ICD-10-CM | POA: Diagnosis not present

## 2022-07-05 DIAGNOSIS — L959 Vasculitis limited to the skin, unspecified: Secondary | ICD-10-CM | POA: Diagnosis not present

## 2022-07-05 DIAGNOSIS — C9202 Acute myeloblastic leukemia, in relapse: Secondary | ICD-10-CM | POA: Diagnosis not present

## 2022-07-06 DIAGNOSIS — C9202 Acute myeloblastic leukemia, in relapse: Secondary | ICD-10-CM | POA: Diagnosis not present

## 2022-07-06 DIAGNOSIS — Z5111 Encounter for antineoplastic chemotherapy: Secondary | ICD-10-CM | POA: Diagnosis not present

## 2022-07-08 DIAGNOSIS — Z95828 Presence of other vascular implants and grafts: Secondary | ICD-10-CM | POA: Diagnosis not present

## 2022-07-08 DIAGNOSIS — C9202 Acute myeloblastic leukemia, in relapse: Secondary | ICD-10-CM | POA: Diagnosis not present

## 2022-07-08 DIAGNOSIS — D61818 Other pancytopenia: Secondary | ICD-10-CM | POA: Diagnosis not present

## 2022-07-08 DIAGNOSIS — Z5111 Encounter for antineoplastic chemotherapy: Secondary | ICD-10-CM | POA: Diagnosis not present

## 2022-07-09 DIAGNOSIS — C9202 Acute myeloblastic leukemia, in relapse: Secondary | ICD-10-CM | POA: Diagnosis not present

## 2022-07-09 DIAGNOSIS — Z5111 Encounter for antineoplastic chemotherapy: Secondary | ICD-10-CM | POA: Diagnosis not present

## 2022-07-10 DIAGNOSIS — Z5111 Encounter for antineoplastic chemotherapy: Secondary | ICD-10-CM | POA: Diagnosis not present

## 2022-07-10 DIAGNOSIS — C9202 Acute myeloblastic leukemia, in relapse: Secondary | ICD-10-CM | POA: Diagnosis not present

## 2022-07-11 DIAGNOSIS — Z5111 Encounter for antineoplastic chemotherapy: Secondary | ICD-10-CM | POA: Diagnosis not present

## 2022-07-11 DIAGNOSIS — C9202 Acute myeloblastic leukemia, in relapse: Secondary | ICD-10-CM | POA: Diagnosis not present

## 2022-07-11 DIAGNOSIS — D61818 Other pancytopenia: Secondary | ICD-10-CM | POA: Diagnosis not present

## 2022-07-11 DIAGNOSIS — Z95828 Presence of other vascular implants and grafts: Secondary | ICD-10-CM | POA: Diagnosis not present

## 2022-08-20 DIAGNOSIS — C9202 Acute myeloblastic leukemia, in relapse: Secondary | ICD-10-CM | POA: Diagnosis not present

## 2022-08-20 DIAGNOSIS — D61818 Other pancytopenia: Secondary | ICD-10-CM | POA: Diagnosis not present

## 2022-08-23 DIAGNOSIS — C9202 Acute myeloblastic leukemia, in relapse: Secondary | ICD-10-CM | POA: Diagnosis not present

## 2022-08-26 DIAGNOSIS — Z01810 Encounter for preprocedural cardiovascular examination: Secondary | ICD-10-CM | POA: Diagnosis not present

## 2022-08-26 DIAGNOSIS — C9202 Acute myeloblastic leukemia, in relapse: Secondary | ICD-10-CM | POA: Diagnosis not present

## 2022-08-30 DIAGNOSIS — C9202 Acute myeloblastic leukemia, in relapse: Secondary | ICD-10-CM | POA: Diagnosis not present

## 2022-08-30 DIAGNOSIS — K5900 Constipation, unspecified: Secondary | ICD-10-CM | POA: Diagnosis not present

## 2022-08-30 DIAGNOSIS — D709 Neutropenia, unspecified: Secondary | ICD-10-CM | POA: Diagnosis not present

## 2022-09-06 DIAGNOSIS — D709 Neutropenia, unspecified: Secondary | ICD-10-CM | POA: Diagnosis not present

## 2022-09-06 DIAGNOSIS — K5900 Constipation, unspecified: Secondary | ICD-10-CM | POA: Diagnosis not present

## 2022-09-06 DIAGNOSIS — C9202 Acute myeloblastic leukemia, in relapse: Secondary | ICD-10-CM | POA: Diagnosis not present

## 2022-09-09 DIAGNOSIS — J984 Other disorders of lung: Secondary | ICD-10-CM | POA: Diagnosis not present

## 2022-09-09 DIAGNOSIS — R0602 Shortness of breath: Secondary | ICD-10-CM | POA: Diagnosis not present

## 2022-09-09 DIAGNOSIS — J9600 Acute respiratory failure, unspecified whether with hypoxia or hypercapnia: Secondary | ICD-10-CM | POA: Diagnosis not present

## 2022-09-09 DIAGNOSIS — D696 Thrombocytopenia, unspecified: Secondary | ICD-10-CM | POA: Diagnosis not present

## 2022-09-09 DIAGNOSIS — J810 Acute pulmonary edema: Secondary | ICD-10-CM | POA: Diagnosis not present

## 2022-09-09 DIAGNOSIS — J90 Pleural effusion, not elsewhere classified: Secondary | ICD-10-CM | POA: Diagnosis not present

## 2022-09-09 DIAGNOSIS — R918 Other nonspecific abnormal finding of lung field: Secondary | ICD-10-CM | POA: Diagnosis not present

## 2022-10-04 DEATH — deceased
